# Patient Record
Sex: Female | Born: 1970
Health system: Southern US, Community
[De-identification: ages and names within clinical notes are randomized; demographics above are authoritative.]

## PROBLEM LIST (undated history)

## (undated) DIAGNOSIS — N926 Irregular menstruation, unspecified: Secondary | ICD-10-CM

## (undated) DIAGNOSIS — M545 Low back pain, unspecified: Secondary | ICD-10-CM

## (undated) DIAGNOSIS — D219 Benign neoplasm of connective and other soft tissue, unspecified: Secondary | ICD-10-CM

## (undated) DIAGNOSIS — E785 Hyperlipidemia, unspecified: Secondary | ICD-10-CM

## (undated) DIAGNOSIS — G43909 Migraine, unspecified, not intractable, without status migrainosus: Secondary | ICD-10-CM

## (undated) DIAGNOSIS — E559 Vitamin D deficiency, unspecified: Secondary | ICD-10-CM

## (undated) DIAGNOSIS — IMO0002 Reserved for concepts with insufficient information to code with codable children: Secondary | ICD-10-CM

## (undated) HISTORY — DX: Irregular menstruation, unspecified: N92.6

## (undated) HISTORY — DX: Hyperlipidemia, unspecified: E78.5

## (undated) HISTORY — DX: Reserved for concepts with insufficient information to code with codable children: IMO0002

## (undated) HISTORY — DX: Benign neoplasm of connective and other soft tissue, unspecified: D21.9

## (undated) HISTORY — DX: Vitamin D deficiency, unspecified: E55.9

## (undated) HISTORY — DX: Migraine, unspecified, not intractable, without status migrainosus: G43.909

## (undated) HISTORY — PX: WISDOM TOOTH EXTRACTION: SHX21

---

## 1997-11-01 ENCOUNTER — Other Ambulatory Visit: Admission: RE | Admit: 1997-11-01 | Discharge: 1997-11-01 | Payer: Self-pay | Admitting: Internal Medicine

## 1998-11-01 ENCOUNTER — Other Ambulatory Visit: Admission: RE | Admit: 1998-11-01 | Discharge: 1998-11-01 | Payer: Self-pay | Admitting: Internal Medicine

## 2001-09-26 ENCOUNTER — Other Ambulatory Visit: Admission: RE | Admit: 2001-09-26 | Discharge: 2001-09-26 | Payer: Self-pay | Admitting: Obstetrics and Gynecology

## 2002-11-03 ENCOUNTER — Other Ambulatory Visit: Admission: RE | Admit: 2002-11-03 | Discharge: 2002-11-03 | Payer: Self-pay | Admitting: Obstetrics and Gynecology

## 2004-01-24 ENCOUNTER — Other Ambulatory Visit: Admission: RE | Admit: 2004-01-24 | Discharge: 2004-01-24 | Payer: Self-pay | Admitting: Obstetrics and Gynecology

## 2005-01-30 ENCOUNTER — Other Ambulatory Visit: Admission: RE | Admit: 2005-01-30 | Discharge: 2005-01-30 | Payer: Self-pay | Admitting: Obstetrics and Gynecology

## 2006-03-21 ENCOUNTER — Other Ambulatory Visit: Admission: RE | Admit: 2006-03-21 | Discharge: 2006-03-21 | Payer: Self-pay | Admitting: Obstetrics & Gynecology

## 2007-01-30 ENCOUNTER — Other Ambulatory Visit: Admission: RE | Admit: 2007-01-30 | Discharge: 2007-01-30 | Payer: Self-pay | Admitting: Obstetrics & Gynecology

## 2008-03-02 ENCOUNTER — Other Ambulatory Visit: Admission: RE | Admit: 2008-03-02 | Discharge: 2008-03-02 | Payer: Self-pay | Admitting: Obstetrics and Gynecology

## 2012-05-26 ENCOUNTER — Encounter: Payer: Self-pay | Admitting: *Deleted

## 2012-05-27 ENCOUNTER — Encounter: Payer: Self-pay | Admitting: Nurse Practitioner

## 2012-05-27 ENCOUNTER — Ambulatory Visit (INDEPENDENT_AMBULATORY_CARE_PROVIDER_SITE_OTHER): Payer: 59 | Admitting: Nurse Practitioner

## 2012-05-27 VITALS — BP 112/60 | HR 66 | Resp 14 | Ht 67.0 in | Wt 148.2 lb

## 2012-05-27 DIAGNOSIS — IMO0001 Reserved for inherently not codable concepts without codable children: Secondary | ICD-10-CM

## 2012-05-27 DIAGNOSIS — Z01419 Encounter for gynecological examination (general) (routine) without abnormal findings: Secondary | ICD-10-CM

## 2012-05-27 DIAGNOSIS — Z309 Encounter for contraceptive management, unspecified: Secondary | ICD-10-CM

## 2012-05-27 DIAGNOSIS — Z0000 Encounter for general adult medical examination without abnormal findings: Secondary | ICD-10-CM

## 2012-05-27 LAB — CBC
Hemoglobin: 13.9 g/dL (ref 12.0–15.0)
MCH: 30.3 pg (ref 26.0–34.0)
MCHC: 33.7 g/dL (ref 30.0–36.0)
MCV: 90 fL (ref 78.0–100.0)
Platelets: 256 10*3/uL (ref 150–400)

## 2012-05-27 LAB — COMPREHENSIVE METABOLIC PANEL
AST: 14 U/L (ref 0–37)
Albumin: 4 g/dL (ref 3.5–5.2)
Alkaline Phosphatase: 36 U/L — ABNORMAL LOW (ref 39–117)
Potassium: 4.6 mEq/L (ref 3.5–5.3)
Sodium: 138 mEq/L (ref 135–145)
Total Protein: 7 g/dL (ref 6.0–8.3)

## 2012-05-27 LAB — POCT URINALYSIS DIPSTICK
Spec Grav, UA: 1.015
Urobilinogen, UA: NEGATIVE
pH, UA: 6.5

## 2012-05-27 LAB — TSH: TSH: 1.197 u[IU]/mL (ref 0.350–4.500)

## 2012-05-27 LAB — LIPID PANEL
Total CHOL/HDL Ratio: 3.1 Ratio
VLDL: 11 mg/dL (ref 0–40)

## 2012-05-27 MED ORDER — LEVONORGESTREL-ETHINYL ESTRAD 0.15-30 MG-MCG PO TABS: 1.0000 | ORAL_TABLET | Freq: Every day | ORAL | Status: DC

## 2012-05-27 NOTE — Patient Instructions (Signed)

## 2012-05-27 NOTE — Progress Notes (Signed)
42 y.o. Single African American Fe here for annual exam.  Menses is regular.  Some cramps but tolerable with OTC NSAID'S.Same partner almost 2 years.Not sexually active. 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  Patient's last menstrual period was 05/07/2012.          Sexually active: no  The current method of family planning is OCP (estrogen/progesterone).    Exercising: no  The patient does not participate in regular exercise at present. Smoker:  no  Health Maintenance: Pap:  05/18/2011 normal with neg HR HPV MMG:  2011 normal Colonoscopy:  never TDaP:  01/30/2007 Labs: Hgb- 13.7; PCP is requesting labs: CMP, LIPIDS, TSH, VIT D, CBC she is fasting today.   reports that she has never smoked. She has never used smokeless tobacco. She reports that she does not drink alcohol or use illicit drugs.  Past Medical History  Diagnosis Date  . Irregular menses   . Abnormal pap     ASCUS as a teenager     History reviewed. No pertinent past surgical history.  Current Outpatient Prescriptions  Medication Sig Dispense Refill  . fluticasone (FLONASE) 50 MCG/ACT nasal spray Place 2 sprays into the nose daily.      Marland Kitchen ibuprofen (ADVIL,MOTRIN) 100 MG chewable tablet Chew 100 mg by mouth every 8 (eight) hours as needed for fever.      Marland Kitchen levonorgestrel-ethinyl estradiol (NORDETTE) 0.15-30 MG-MCG tablet Take 1 tablet by mouth daily.      . montelukast (SINGULAIR) 10 MG tablet Take 10 mg by mouth as needed.      . Prenatal Vit-Fe Fumarate-FA (MULTIVITAMIN-PRENATAL) 27-0.8 MG TABS Take 1 tablet by mouth daily at 12 noon.      . vitamin B-12 (CYANOCOBALAMIN) 100 MCG tablet Take 50 mcg by mouth daily.      . calcium carbonate (OS-CAL - DOSED IN MG OF ELEMENTAL CALCIUM) 1250 MG tablet Take 1 tablet by mouth daily.      . Calcium-Vitamin D-Vitamin K (CALCIUM + D + K) 750-500-40 MG-UNT-MCG TABS Take by mouth daily.      . Ibuprofen & Caffeine-Vitamins 400 MG KIT Take by mouth  as needed.       No current facility-administered medications for this visit.    Family History  Problem Relation Age of Onset  . Cancer Mother     ROS:  Pertinent items are noted in HPI.  Otherwise, a comprehensive ROS was negative.  Exam:   BP 112/60  Pulse 66  Resp 14  Ht 5\' 7"  (1.702 m)  Wt 148 lb 3.2 oz (67.223 kg)  BMI 23.21 kg/m2  LMP 05/07/2012 Height: 5\' 7"  (170.2 cm)  Ht Readings from Last 3 Encounters:  05/27/12 5\' 7"  (1.702 m)    General appearance: alert, cooperative and appears stated age Head: Normocephalic, without obvious abnormality, atraumatic Neck: no adenopathy, supple, symmetrical, trachea midline and thyroid normal to inspection and palpation Lungs: clear to auscultation bilaterally Breasts: normal appearance, no masses or tenderness Heart: regular rate and rhythm Abdomen: soft, non-tender; no masses,  no organomegaly Extremities: extremities normal, atraumatic, no cyanosis or edema Skin: Skin color, texture, turgor normal. No rashes or lesions Lymph nodes: Cervical, supraclavicular, and axillary nodes normal. No abnormal inguinal nodes palpated Neurologic: Grossly normal   Pelvic: External genitalia:  no lesions              Urethra:  normal appearing urethra with no masses, tenderness or lesions  Bartholin's and Skene's: normal                 Vagina: normal appearing vagina with normal color and discharge, no lesions              Cervix: anteverted              Pap taken: no Bimanual Exam:  Uterus:uppper  normal size, contour, position, consistency, mobility, non-tender              Adnexa: no mass, fullness, tenderness               Rectovaginal: Confirms               Anus:  normal sphincter tone, no lesions  A:  Well Woman with normal exam  Not SA; OCP for cycle regulation  P:   Pap smear as per guidelines   Mammogram at next Birthday  counseled on adequate intake of calcium and vitamin D,   diet and exercise  return  annually or prn  An After Visit Summary was printed and given to the patient.

## 2012-05-29 ENCOUNTER — Telehealth: Payer: Self-pay | Admitting: *Deleted

## 2012-05-29 NOTE — Telephone Encounter (Signed)
Message copied by Osie Bond on Thu May 29, 2012  1:46 PM ------      Message from: Ria Comment R      Created: Wed May 28, 2012 12:30 PM       Send patient the results. All is good. ------

## 2012-05-29 NOTE — Telephone Encounter (Signed)
LVM for pt to return my in regards to lab results.

## 2012-05-29 NOTE — Telephone Encounter (Signed)
Message copied by Osie Bond on Thu May 29, 2012  2:50 PM ------      Message from: Ria Comment R      Created: Wed May 28, 2012 12:30 PM       Send patient the results. All is good. ------

## 2012-05-29 NOTE — Telephone Encounter (Signed)
Pt is aware of all lab results and is agreeable to 600-800 IU of vitamin D (OTC) qd.

## 2012-05-29 NOTE — Progress Notes (Signed)
Encounter reviewed by Dr. Daniesha Driver Silva.  

## 2012-06-03 ENCOUNTER — Other Ambulatory Visit: Payer: Self-pay | Admitting: Nurse Practitioner

## 2012-11-19 ENCOUNTER — Telehealth: Payer: Self-pay | Admitting: Nurse Practitioner

## 2012-11-19 NOTE — Telephone Encounter (Signed)
Patient has a preference in regards to nurse practitioner practice. Patient is also a Publishing rights manager.

## 2012-12-01 NOTE — Telephone Encounter (Signed)
I don't know of any  NP practices alone, there are numerous internal and family practices that have NP's

## 2013-01-28 ENCOUNTER — Ambulatory Visit: Payer: 59 | Admitting: Nurse Practitioner

## 2013-01-28 ENCOUNTER — Telehealth: Payer: Self-pay | Admitting: Nurse Practitioner

## 2013-01-28 NOTE — Telephone Encounter (Signed)
Pt rescheduled appt for today because she can't get off work. Rescheduled to 02/06/13.

## 2013-02-06 ENCOUNTER — Ambulatory Visit (INDEPENDENT_AMBULATORY_CARE_PROVIDER_SITE_OTHER): Payer: BC Managed Care – PPO | Admitting: Nurse Practitioner

## 2013-02-06 ENCOUNTER — Encounter: Payer: Self-pay | Admitting: Nurse Practitioner

## 2013-02-06 VITALS — BP 92/60 | HR 64 | Ht 67.0 in | Wt 150.0 lb

## 2013-02-06 DIAGNOSIS — N952 Postmenopausal atrophic vaginitis: Secondary | ICD-10-CM

## 2013-02-06 MED ORDER — ESTRADIOL 0.1 MG/GM VA CREA: TOPICAL_CREAM | VAGINAL | Status: DC

## 2013-02-06 NOTE — Progress Notes (Signed)
Subjective:     Patient ID: Mallory Jimenez, female   DOB: June 30, 1970, 43 y.o.   MRN: [id number]  HPI This 43 yo SAA Fe presents for a consult visit to discuss an increase in vulvar dryness with wiping or bathing for about 6 months.  Not SA very often but no symptoms with SA and does not need to use OTC lubrication.  Still on Levora  OCP.  Most of symptoms are externally.  No urinary symptoms.  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 She is still dating same partner and they have made no decisions on childbearing and getting married yet.   Review of Systems  Constitutional: Negative.   HENT: Negative.   Respiratory: Negative.   Cardiovascular: Negative.   Gastrointestinal: Negative.   Genitourinary: Negative.   Musculoskeletal: Negative.   Skin: Negative.   Neurological: Negative.   Psychiatric/Behavioral: Negative.        Objective:   Physical Exam  Constitutional: She is oriented to person, place, and time. She appears well-developed and well-nourished. No distress.  Exam is not indicated today,  Neurological: She is alert and oriented to person, place, and time.  Psychiatric: She has a normal mood and affect. Her behavior is normal. Judgment and thought content normal.       Assessment:     Vulvar dryness    Plan:     Will try estrace vaginal cream pea size amount to external vulvar and introitus as directed twice weekly. Will recheck at AEX. She prefers no oral treatment unless needed     consult time is 15 minutes.

## 2013-02-06 NOTE — Patient Instructions (Signed)
Atrophic Vaginitis Atrophic vaginitis is a problem of low levels of estrogen in women. This problem can happen at any age. It is most common in women who have gone through menopause ("the change").  HOW WILL I KNOW IF I HAVE THIS PROBLEM? You may have:  Trouble with peeing (urinating), such as:  Going to the bathroom often.  A hard time holding your pee until you reach a bathroom.  Leaking pee.  Having pain when you pee.  Itching or a burning feeling.  Vaginal bleeding and spotting.  Pain during sex.  Dryness of the vagina.  A yellow, bad-smelling fluid (discharge) coming from the vagina. HOW WILL MY DOCTOR CHECK FOR THIS PROBLEM?  During your exam, your doctor will likely find the problem.  If there is a vaginal fluid, it may be checked for infection. HOW WILL THIS PROBLEM BE TREATED? Keep the vulvar skin as clean as possible. Moisturizers and lubricants can help with some of the symptoms. Estrogen replacement can help. There are 2 ways to take estrogen:  Systemic estrogen gets estrogen to your whole body. It takes many weeks or months before the symptoms get better.  You take an estrogen pill.  You use a skin patch. This is a patch that you put on your skin.  If you still have your uterus, your doctor may ask you to take a hormone. Talk to your doctor about the right medicine for you.  Estrogen cream.  This puts estrogen only at the part of your body where you apply it. The cream is put into the vagina or put on the vulvar skin. For some women, estrogen cream works faster than pills or the patch. CAN ALL WOMEN WITH THIS PROBLEM USE ESTROGEN? No. Women with certain types of cancer, liver problems, or problems with blood clots should not take estrogen. Your doctor can help you decide the best treatment for your symptoms. Document Released: 06/27/2007 Document Revised: 04/02/2011 Document Reviewed: 06/27/2007 ExitCare Patient Information 2014 [employer].  

## 2013-02-09 NOTE — Progress Notes (Signed)
Encounter reviewed by Dr. Virgal Warmuth Silva.  

## 2013-03-30 ENCOUNTER — Encounter: Payer: Self-pay | Admitting: Nurse Practitioner

## 2013-04-02 ENCOUNTER — Telehealth: Payer: Self-pay | Admitting: Nurse Practitioner

## 2013-04-02 NOTE — Telephone Encounter (Signed)
Per patient request in MyChart last labs were routed via EPIC and last pap was faxed manually from patient's paper chart to PCP. MyChart message sent to patient to notify her of this with note to let us know if PCP does not receive records.

## 2013-05-03 ENCOUNTER — Other Ambulatory Visit: Payer: Self-pay | Admitting: Nurse Practitioner

## 2013-05-08 ENCOUNTER — Other Ambulatory Visit: Payer: Self-pay | Admitting: *Deleted

## 2013-05-08 DIAGNOSIS — IMO0001 Reserved for inherently not codable concepts without codable children: Secondary | ICD-10-CM

## 2013-05-08 MED ORDER — LEVONORGESTREL-ETHINYL ESTRAD 0.15-30 MG-MCG PO TABS: 1.0000 | ORAL_TABLET | Freq: Every day | ORAL | Status: DC

## 2013-05-08 NOTE — Telephone Encounter (Signed)
[facility] pharmacist Jeani Hawking called requesting refill for pt- Levora.  Last AEX and refill 5/614 x 1 year Next appt 05/28/13  Approved 28 days of Rx. Patient will need AEX for further refills. - Pharmacist notified.

## 2013-05-28 ENCOUNTER — Encounter: Payer: Self-pay | Admitting: Nurse Practitioner

## 2013-05-28 ENCOUNTER — Ambulatory Visit (INDEPENDENT_AMBULATORY_CARE_PROVIDER_SITE_OTHER): Payer: BC Managed Care – PPO | Admitting: Nurse Practitioner

## 2013-05-28 VITALS — BP 100/64 | HR 92 | Ht 67.0 in | Wt 148.0 lb

## 2013-05-28 DIAGNOSIS — Z0000 Encounter for general adult medical examination without abnormal findings: Secondary | ICD-10-CM

## 2013-05-28 DIAGNOSIS — R8290 Unspecified abnormal findings in urine: Secondary | ICD-10-CM

## 2013-05-28 DIAGNOSIS — R82998 Other abnormal findings in urine: Secondary | ICD-10-CM

## 2013-05-28 DIAGNOSIS — Z01419 Encounter for gynecological examination (general) (routine) without abnormal findings: Secondary | ICD-10-CM

## 2013-05-28 LAB — POCT URINALYSIS DIPSTICK
Bilirubin, UA: NEGATIVE
Glucose, UA: NEGATIVE
Ketones, UA: NEGATIVE
NITRITE UA: NEGATIVE
Protein, UA: NEGATIVE
RBC UA: NEGATIVE
UROBILINOGEN UA: NEGATIVE
pH, UA: 8

## 2013-05-28 MED ORDER — LEVONORGESTREL-ETHINYL ESTRAD 0.15-30 MG-MCG PO TABS: 1.0000 | ORAL_TABLET | Freq: Every day | ORAL | Status: DC

## 2013-05-28 NOTE — Patient Instructions (Signed)

## 2013-05-28 NOTE — Progress Notes (Signed)
Patient ID: Mallory Jimenez, female   DOB: 1970-10-03, 43 y.o.   MRN: [id number] 42 y.o. G0P0 Single African American Fe here for annual exam.  Menses are regular, at 5 days.moderate to light. On OCP for cycle regulation.  Ended almost 3 year relationship.  "He has to find himself".  They were never SA. She is disappointed in her long range plans of children with him.  She is almost completed school for PHD in [employer].  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  Patient's last menstrual period was 05/04/2013.          Sexually active: no  The current method of family planning is OCP (estrogen/progesterone) and abstinence.    Exercising: no  The patient does not participate in regular exercise at present. Smoker:  no  Health Maintenance: Pap:  05/18/2011 normal with neg HR HPV (remote history of ASCUS as teen) MMG:  05/26/13, [facility],  normal TDaP:  01/30/2007 Labs: PCP  Urine:  Trace Leuk's   reports that she has never smoked. She has never used smokeless tobacco. She reports that she does not drink alcohol or use illicit drugs.  Past Medical History  Diagnosis Date  . Irregular menses   . Abnormal pap     ASCUS as a teenager     Past Surgical History  Procedure Laterality Date  . Wisdom tooth extraction Bilateral age 39    Current Outpatient Prescriptions  Medication Sig Dispense Refill  . Ascorbic Acid (VITAMIN C) 100 MG tablet Take 100 mg by mouth daily.      . Cholecalciferol (VITAMIN D) 1000 UNITS capsule Take 1,000 Units by mouth daily.      Marland Kitchen estradiol (ESTRACE) 0.1 MG/GM vaginal cream Use 1 gm twice weekly  42.5 g  3  . fluticasone (FLONASE) 50 MCG/ACT nasal spray Place 2 sprays into the nose daily.      Marland Kitchen ibuprofen (ADVIL,MOTRIN) 100 MG chewable tablet Chew 100 mg by mouth every 8 (eight) hours as needed for fever.      Marland Kitchen levonorgestrel-ethinyl estradiol (LEVORA 0.15/30, 28,) 0.15-30 MG-MCG tablet Take 1  tablet by mouth daily.  3 Package  3  . montelukast (SINGULAIR) 10 MG tablet Take 10 mg by mouth as needed.      . Prenatal Vit-Fe Fumarate-FA (MULTIVITAMIN-PRENATAL) 27-0.8 MG TABS Take 1 tablet by mouth daily at 12 noon.      . vitamin B-12 (CYANOCOBALAMIN) 100 MCG tablet Take 50 mcg by mouth daily.      . Vitamin D, Ergocalciferol, (DRISDOL) 50000 UNITS CAPS capsule Take 1 capsule by mouth once a week.       No current facility-administered medications for this visit.    Family History  Problem Relation Age of Onset  . Hypertension Mother     had 2 tumorss abdominal that were benign, knee replacements  . Hypertension Father   . Hypertension Maternal Grandmother   . Hypertension Paternal Grandmother     back surgery    ROS:  Pertinent items are noted in HPI.  Otherwise, a comprehensive ROS was negative.  Exam:   BP 100/64  Pulse 92  Ht 5\' 7"  (1.702 m)  Wt 148 lb (67.132 kg)  BMI 23.17 kg/m2  LMP 05/04/2013 Height: 5\' 7"  (170.2 cm)  Ht Readings from Last 3 Encounters:  05/28/13 5\' 7"  (1.702 m)  02/06/13 5\' 7"  (1.702 m)  05/27/12 5\' 7"  (1.702 m)  General appearance: alert, cooperative and appears stated age Head: Normocephalic, without obvious abnormality, atraumatic Neck: no adenopathy, supple, symmetrical, trachea midline and thyroid normal to inspection and palpation Lungs: clear to auscultation bilaterally Breasts: normal appearance, no masses or tenderness Heart: regular rate and rhythm Abdomen: soft, non-tender; no masses,  no organomegaly Extremities: extremities normal, atraumatic, no cyanosis or edema Skin: Skin color, texture, turgor normal. No rashes or lesions Lymph nodes: Cervical, supraclavicular, and axillary nodes normal. No abnormal inguinal nodes palpated Neurologic: Grossly normal   Pelvic: External genitalia:  no lesions              Urethra:  normal appearing urethra with no masses, tenderness or lesions              Bartholin's and Skene's:  normal                 Vagina: normal appearing vagina with normal color and discharge, no lesions              Cervix: anteverted              Pap taken: yes Bimanual Exam:  Uterus:  normal size, contour, position, consistency, mobility, non-tender              Adnexa: no mass, fullness, tenderness               Rectovaginal: Confirms               Anus:  normal sphincter tone, no lesions  A:  Well Woman with normal exam  OCP for cycle regulation  Not SA since about 2009  History of Vit D deficiency  P:   Reviewed health and wellness pertinent to exam  Pap smear taken today  Mammogram due 05/2014  Refill Levora for a year, does not need refill of Estrace at this time.  Counseled on breast self exam, mammography screening, use and side effects of OCP's, adequate intake of calcium and vitamin D, diet and exercise return annually or prn  An After Visit Summary was printed and given to the patient.  I am very proud of her for accomplishment of her goals both educationally and personal relationship with God.

## 2013-05-29 LAB — URINE CULTURE
Colony Count: NO GROWTH
Organism ID, Bacteria: NO GROWTH

## 2013-06-01 LAB — IPS PAP TEST WITH HPV

## 2013-06-02 ENCOUNTER — Other Ambulatory Visit: Payer: Self-pay | Admitting: Nurse Practitioner

## 2013-06-02 NOTE — Telephone Encounter (Signed)
Last refill 05/28/13 #3/ 3 refill as phone in. Rx resend as normal as prescribed.

## 2013-06-03 NOTE — Progress Notes (Signed)
Encounter reviewed by Dr. Tove Wideman Silva.  

## 2013-11-06 ENCOUNTER — Other Ambulatory Visit: Payer: Self-pay

## 2014-05-05 ENCOUNTER — Other Ambulatory Visit: Payer: Self-pay | Admitting: Nurse Practitioner

## 2014-05-05 NOTE — Telephone Encounter (Signed)
Medication refill request: Levora  Last AEX:  05/28/13 PG Next AEX: 06/03/14 PG Last MMG (if hormonal medication request): none Refill authorized: 06/02/13 #3packs/3R. Today #1pack/0R?  Routed to DL

## 2014-06-03 ENCOUNTER — Encounter: Payer: Self-pay | Admitting: Nurse Practitioner

## 2014-06-03 ENCOUNTER — Ambulatory Visit (INDEPENDENT_AMBULATORY_CARE_PROVIDER_SITE_OTHER): Payer: BC Managed Care – PPO | Admitting: Nurse Practitioner

## 2014-06-03 VITALS — BP 96/62 | HR 64 | Ht 67.0 in | Wt 156.0 lb

## 2014-06-03 DIAGNOSIS — Z01419 Encounter for gynecological examination (general) (routine) without abnormal findings: Secondary | ICD-10-CM | POA: Diagnosis not present

## 2014-06-03 DIAGNOSIS — Z0000 Encounter for general adult medical examination without abnormal findings: Secondary | ICD-10-CM

## 2014-06-03 LAB — POCT URINALYSIS DIPSTICK
Bilirubin, UA: NEGATIVE
Blood, UA: NEGATIVE
GLUCOSE UA: NEGATIVE
KETONES UA: NEGATIVE
Leukocytes, UA: NEGATIVE
NITRITE UA: NEGATIVE
PH UA: 6
PROTEIN UA: NEGATIVE
UROBILINOGEN UA: NEGATIVE

## 2014-06-03 MED ORDER — LEVONORGESTREL-ETHINYL ESTRAD 0.15-30 MG-MCG PO TABS: 1.0000 | ORAL_TABLET | Freq: Every day | ORAL | Status: DC

## 2014-06-03 NOTE — Progress Notes (Signed)
Patient ID: Mallory Jimenez, female   DOB: 06-Aug-1970, 44 y.o.   MRN: [id number] 44 y.o. G0P0 Single  African American Fe here for annual exam. Menses now at 5 days.  Not dating.  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   Patient's last menstrual period was 05/03/2014 (approximate).          Sexually active: No.  The current method of family planning is OCP (estrogen/progesterone) and abstinence.    Exercising: Yes.    Gym/ health club routine includes aerobics. Smoker:  no  Health Maintenance: Pap:  05/28/13, negative with neg HR HPV MMG:  06/01/14, no results at time of visit, she will fax copy of report TDaP:  01/30/07  Labs:  HB:  PCP  Urine: negative   reports that she has never smoked. She has never used smokeless tobacco. She reports that she does not drink alcohol or use illicit drugs.  Past Medical History  Diagnosis Date  . Irregular menses   . Abnormal pap     ASCUS as a teenager     Past Surgical History  Procedure Laterality Date  . Wisdom tooth extraction Bilateral age 43    Current Outpatient Prescriptions  Medication Sig Dispense Refill  . Ascorbic Acid (VITAMIN C) 100 MG tablet Take 100 mg by mouth daily.    . Cholecalciferol (VITAMIN D) 1000 UNITS capsule Take 1,000 Units by mouth daily.    Marland Kitchen estradiol (ESTRACE) 0.1 MG/GM vaginal cream Use 1 gm twice weekly 42.5 g 3  . fluticasone (FLONASE) 50 MCG/ACT nasal spray Place 2 sprays into the nose daily.    Marland Kitchen ibuprofen (ADVIL,MOTRIN) 100 MG chewable tablet Chew 100 mg by mouth every 8 (eight) hours as needed for fever.    Marland Kitchen levonorgestrel-ethinyl estradiol (LEVORA 0.15/30, 28,) 0.15-30 MG-MCG tablet Take 1 tablet by mouth daily. 3 Package 3  . montelukast (SINGULAIR) 10 MG tablet Take 10 mg by mouth at bedtime.     . Prenatal Vit-Fe Fumarate-FA (MULTIVITAMIN-PRENATAL) 27-0.8 MG TABS Take 1 tablet by mouth daily at 12 noon.    . vitamin  B-12 (CYANOCOBALAMIN) 100 MCG tablet Take 50 mcg by mouth daily.    . Vitamin D, Ergocalciferol, (DRISDOL) 50000 UNITS CAPS capsule Take 1 capsule by mouth once a week.     No current facility-administered medications for this visit.    Family History  Problem Relation Age of Onset  . Hypertension Mother     had 2 tumorss abdominal that were benign, knee replacements  . Hypertension Father   . Hypertension Maternal Grandmother   . Hypertension Paternal Grandmother     back surgery    ROS:  Pertinent items are noted in HPI.  Otherwise, a comprehensive ROS was negative.  Exam:   BP 96/62 mmHg  Pulse 64  Ht 5\' 7"  (1.702 m)  Wt 156 lb (70.761 kg)  BMI 24.43 kg/m2  LMP 05/03/2014 (Approximate) Height: 5\' 7"  (170.2 cm) Ht Readings from Last 3 Encounters:  06/03/14 5\' 7"  (1.702 m)  05/28/13 5\' 7"  (1.702 m)  02/06/13 5\' 7"  (1.702 m)    General appearance: alert, cooperative and appears stated age Head: Normocephalic, without obvious abnormality, atraumatic Neck: no adenopathy, supple, symmetrical, trachea midline and thyroid normal to inspection and palpation Lungs: clear to auscultation bilaterally Breasts: normal appearance, no masses or tenderness Heart: regular rate and rhythm Abdomen: soft, non-tender;  no masses,  no organomegaly Extremities: extremities normal, atraumatic, no cyanosis or edema Skin: Skin color, texture, turgor normal. No rashes or lesions Lymph nodes: Cervical, supraclavicular, and axillary nodes normal. No abnormal inguinal nodes palpated Neurologic: Grossly normal   Pelvic: External genitalia:  no lesions              Urethra:  normal appearing urethra with no masses, tenderness or lesions              Bartholin's and Skene's: normal                 Vagina: normal appearing vagina with normal color and discharge, no lesions              Cervix: anteverted              Pap taken: No. Bimanual Exam:  Uterus:  normal size, contour, position,  consistency, mobility, non-tender              Adnexa: no mass, fullness, tenderness               Rectovaginal: Confirms               Anus:  normal sphincter tone, no lesions  Chaperone present: no  A:  Well Woman with normal exam  OCP for cycle regulation Not SA since about 2009 History of Vit D deficiency   P:   Reviewed health and wellness pertinent to exam  Pap smear not taken today  Mammogram is due 05/2015  Refill on OCP for a year - Levora  Counseled on breast self exam, mammography screening, use and side effects of OCP's, adequate intake of calcium and vitamin D, diet and exercise return annually or prn  An After Visit Summary was printed and given to the patient.

## 2014-06-03 NOTE — Patient Instructions (Signed)

## 2014-06-04 NOTE — Progress Notes (Signed)
Encounter reviewed by Dr. Teyonna Plaisted Silva.  

## 2014-06-07 ENCOUNTER — Telehealth: Payer: Self-pay | Admitting: Nurse Practitioner

## 2014-06-07 NOTE — Telephone Encounter (Signed)
Left message on voicemail to call. Records are ready to be picked up in drawer.

## 2014-06-17 NOTE — Telephone Encounter (Signed)
Per phone call to patient, she will come by and pick records up this afternoon.

## 2015-05-06 ENCOUNTER — Other Ambulatory Visit: Payer: Self-pay | Admitting: Nurse Practitioner

## 2015-05-09 NOTE — Telephone Encounter (Signed)
Medication refill request: OCP Last AEX:  06-03-14 Next AEX: 06-03-15 Last MMG (if hormonal medication request): Never Refill authorized: please advise

## 2015-06-06 ENCOUNTER — Encounter: Payer: Self-pay | Admitting: Nurse Practitioner

## 2015-06-06 ENCOUNTER — Ambulatory Visit (INDEPENDENT_AMBULATORY_CARE_PROVIDER_SITE_OTHER): Payer: BC Managed Care – PPO | Admitting: Nurse Practitioner

## 2015-06-06 ENCOUNTER — Telehealth: Payer: Self-pay | Admitting: Nurse Practitioner

## 2015-06-06 VITALS — BP 100/66 | HR 72 | Ht 67.0 in | Wt 155.0 lb

## 2015-06-06 DIAGNOSIS — Z0000 Encounter for general adult medical examination without abnormal findings: Secondary | ICD-10-CM | POA: Diagnosis not present

## 2015-06-06 DIAGNOSIS — N852 Hypertrophy of uterus: Secondary | ICD-10-CM

## 2015-06-06 DIAGNOSIS — Z01419 Encounter for gynecological examination (general) (routine) without abnormal findings: Secondary | ICD-10-CM | POA: Diagnosis not present

## 2015-06-06 LAB — POCT URINALYSIS DIPSTICK
BILIRUBIN UA: NEGATIVE
Glucose, UA: NEGATIVE
KETONES UA: NEGATIVE
LEUKOCYTES UA: NEGATIVE
NITRITE UA: NEGATIVE
PH UA: 6
PROTEIN UA: NEGATIVE
RBC UA: NEGATIVE
Urobilinogen, UA: NEGATIVE

## 2015-06-06 LAB — HIV ANTIBODY (ROUTINE TESTING W REFLEX): HIV 1&2 Ab, 4th Generation: NONREACTIVE

## 2015-06-06 MED ORDER — LEVONORGESTREL-ETHINYL ESTRAD 0.15-30 MG-MCG PO TABS: 1.0000 | ORAL_TABLET | Freq: Every day | ORAL | Status: DC

## 2015-06-06 NOTE — Telephone Encounter (Signed)
Patient is getting her Vitamin D from her PCP. Patient does not need this added to her labs today.

## 2015-06-06 NOTE — Progress Notes (Signed)
Patient ID: Mallory Jimenez, female   DOB: 08-29-1970, 45 y.o.   MRN: [id number]  45 y.o. G0P0000 Single  African American Fe here for annual exam.  Menses now at 4-5 days.  Moderate to light. Some cramps X 1 day.   She remains on OCP for cycle regulation.  She is still on RX Vit D and thinks PCP is giving her RX.  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  She is very excited and pleased with her job.  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  She is not dating or SA.  Patient's last menstrual period was 06/01/2015 (exact date).          Sexually active: No.  The current method of family planning is OCP (estrogen/progesterone).    Exercising: No. No regular exercise. Smoker:  no  Health Maintenance: Pap: 05/28/13, Negative with neg HR HPV MMG:  2011 normal TDaP:  01/30/2007 HIV: done today Labs: HB: 13.0  Urine: negative   reports that she has never smoked. She has never used smokeless tobacco. She reports that she does not drink alcohol or use illicit drugs.  Past Medical History  Diagnosis Date  . Irregular menses   . Abnormal pap     ASCUS as a teenager     Past Surgical History  Procedure Laterality Date  . Wisdom tooth extraction Bilateral age 75    Current Outpatient Prescriptions  Medication Sig Dispense Refill  . aspirin EC 81 MG tablet Take 81 mg by mouth 2 (two) times a week.    Marland Kitchen b complex vitamins tablet Take 1 tablet by mouth daily.    . Biotin 1000 MCG tablet Take 1,000 mcg by mouth daily.    . Calcium Carb-Cholecalciferol (CALCIUM 600/VITAMIN D3) 600-800 MG-UNIT TABS Take 1 tablet by mouth daily.    Marland Kitchen estradiol (ESTRACE) 0.1 MG/GM vaginal cream Use 1 gm twice weekly 42.5 g 3  . fluticasone (FLONASE) 50 MCG/ACT nasal spray Place 2 sprays into the nose daily.    Marland Kitchen ibuprofen (ADVIL,MOTRIN) 100 MG chewable tablet Chew 100 mg by mouth every 8 (eight) hours as needed for fever.    Marland Kitchen levonorgestrel-ethinyl estradiol (LEVORA  0.15/30, 28,) 0.15-30 MG-MCG tablet Take 1 tablet by mouth daily. 84 tablet 4  . montelukast (SINGULAIR) 10 MG tablet Take 10 mg by mouth at bedtime.     . Prenatal Vit-Fe Fumarate-FA (MULTIVITAMIN-PRENATAL) 27-0.8 MG TABS Take 1 tablet by mouth daily at 12 noon.    . vitamin C (ASCORBIC ACID) 500 MG tablet Take 500 mg by mouth daily.    . Vitamin D, Ergocalciferol, (DRISDOL) 50000 UNITS CAPS capsule Take 1 capsule by mouth once a week.     No current facility-administered medications for this visit.    Family History  Problem Relation Age of Onset  . Hypertension Mother   . Other Mother     benign abdominal tumor x 2  . Hypertension Father   . Hypertension Maternal Grandmother   . Hypertension Paternal Grandmother   . Lymphoma Cousin 30    Non hogkins lymphoma    ROS:  Pertinent items are noted in HPI.  Otherwise, a comprehensive ROS was negative.  Exam:   BP 100/66 mmHg  Pulse 72  Ht 5\' 7"  (1.702 m)  Wt 155 lb (70.308 kg)  BMI 24.27 kg/m2  LMP 06/01/2015 (Exact Date) Height: 5\' 7"  (170.2 cm) Ht Readings from Last 3 Encounters:  06/06/15 5'  7" (1.702 m)  06/03/14 5\' 7"  (1.702 m)  05/28/13 5\' 7"  (1.702 m)    General appearance: alert, cooperative and appears stated age Head: Normocephalic, without obvious abnormality, atraumatic Neck: no adenopathy, supple, symmetrical, trachea midline and thyroid normal to inspection and palpation Lungs: clear to auscultation bilaterally Breasts: normal appearance, no masses or tenderness Heart: regular rate and rhythm Abdomen: soft, non-tender; no masses,  no organomegaly Extremities: extremities normal, atraumatic, no cyanosis or edema Skin: Skin color, texture, turgor normal. No rashes or lesions Lymph nodes: Cervical, supraclavicular, and axillary nodes normal. No abnormal inguinal nodes palpated Neurologic: Grossly normal   Pelvic: External genitalia:  no lesions              Urethra:  normal appearing urethra with no masses,  tenderness or lesions              Bartholin's and Skene's: normal                 Vagina: normal appearing vagina with normal color and discharge, no lesions              Cervix: anteverted              Pap taken: No. Bimanual Exam:  Uterus:  enlarged, 6 weeks size              Adnexa: no mass, fullness, tenderness               Rectovaginal: Confirms               Anus:  normal sphincter tone, no lesions  Chaperone present: no  A:  Well Woman with normal exam  OCP for cycle regulation Not SA since about 2009 History of Vit D deficiency  Enlarged uterus ? Fibroid - no prior notation of this    P:   Reviewed health and wellness pertinent to exam  Pap smear as above  Mammogram is due now and will get next week.  She will also sign ROI for her last Mammo a year ago.  Will get PUS and evaluate possible fibroid uterus vs position.  Will follow labs  Counseled on breast self exam, mammography screening, use and side effects of OCP's, adequate intake of calcium and vitamin D, diet and exercise return annually or prn  An After Visit Summary was printed and given to the patient.

## 2015-06-06 NOTE — Patient Instructions (Signed)

## 2015-06-07 ENCOUNTER — Telehealth: Payer: Self-pay | Admitting: Nurse Practitioner

## 2015-06-07 LAB — HEMOGLOBIN, FINGERSTICK: HEMOGLOBIN, FINGERSTICK: 13 g/dL (ref 12.0–16.0)

## 2015-06-07 NOTE — Telephone Encounter (Signed)
OK thanks for information.

## 2015-06-07 NOTE — Telephone Encounter (Signed)
Called patient to review benefits for a recommended procedure. Left Voicemail requesting a call back. °

## 2015-06-08 NOTE — Telephone Encounter (Signed)
OK for this date and time as this is non emergent.  Ok to close encounter.

## 2015-06-08 NOTE — Telephone Encounter (Signed)
Spoke with patient regarding benefits for recommended ultrasound. Patient has scheduling conflicts with Tuesdays and Thursdays, due to her work schedule. The first available appointment that will work with her schedule is 06/30/15.  Appointment has been scheduled with Dr Quincy Simmonds on 06/30/15. Routing to [employer] for final approval

## 2015-06-12 NOTE — Progress Notes (Signed)
Encounter reviewed by Dr. Brook Amundson C. Silva.  

## 2015-06-28 ENCOUNTER — Telehealth: Payer: Self-pay | Admitting: Obstetrics and Gynecology

## 2015-06-28 NOTE — Telephone Encounter (Signed)
Left message requesting patient to return call.  We will need to discuss making changes to her ultrasound appointment on 06/30/15

## 2015-06-28 NOTE — Telephone Encounter (Signed)
Ok to close encounter per provider

## 2015-06-30 ENCOUNTER — Encounter: Payer: Self-pay | Admitting: Obstetrics and Gynecology

## 2015-06-30 ENCOUNTER — Ambulatory Visit (INDEPENDENT_AMBULATORY_CARE_PROVIDER_SITE_OTHER): Payer: BC Managed Care – PPO | Admitting: Obstetrics and Gynecology

## 2015-06-30 ENCOUNTER — Ambulatory Visit (INDEPENDENT_AMBULATORY_CARE_PROVIDER_SITE_OTHER): Payer: BC Managed Care – PPO

## 2015-06-30 VITALS — BP 100/64 | HR 76 | Ht 67.0 in | Wt 158.0 lb

## 2015-06-30 DIAGNOSIS — N852 Hypertrophy of uterus: Secondary | ICD-10-CM

## 2015-06-30 DIAGNOSIS — D259 Leiomyoma of uterus, unspecified: Secondary | ICD-10-CM | POA: Diagnosis not present

## 2015-06-30 NOTE — Progress Notes (Signed)
GYNECOLOGY  VISIT   HPI: 45 y.o.   Single  African American  female   [place] with Patient's last menstrual period was 06/01/2015 (approximate).   here for   Enlarged uterus on routine physical exam.  Uterus 6 week size.  On combined oral contraceptives for cycle regulation.  Menses are not as heavy.  Some low back pain intermittently.  Cramping with cycles which is not significant.   Considering childbearing.  No prior pregnancy. Looking for a good partner.  Patient is a Designer, jewellery and teaches.   GYNECOLOGIC HISTORY: Patient's last menstrual period was 06/01/2015 (approximate). Contraception:  OCPs.        OB History    Gravida Para Term Preterm AB TAB SAB Ectopic Multiple Living   0 0 0 0 0 0 0 0 0 0          There are no active problems to display for this patient.   Past Medical History  Diagnosis Date  . Irregular menses   . Abnormal pap     ASCUS as a teenager     Past Surgical History  Procedure Laterality Date  . Wisdom tooth extraction Bilateral age 45    Current Outpatient Prescriptions  Medication Sig Dispense Refill  . aspirin EC 81 MG tablet Take 81 mg by mouth 2 (two) times a week.    Marland Kitchen b complex vitamins tablet Take 1 tablet by mouth daily.    . Biotin 1000 MCG tablet Take 1,000 mcg by mouth daily.    . Calcium Carb-Cholecalciferol (CALCIUM 600/VITAMIN D3) 600-800 MG-UNIT TABS Take 1 tablet by mouth daily.    Marland Kitchen estradiol (ESTRACE) 0.1 MG/GM vaginal cream Use 1 gm twice weekly (Patient taking differently: as needed. Use 1 gm twice weekly) 42.5 g 3  . fluticasone (FLONASE) 50 MCG/ACT nasal spray Place 2 sprays into the nose as needed.     Marland Kitchen ibuprofen (ADVIL,MOTRIN) 100 MG chewable tablet Chew 100 mg by mouth every 8 (eight) hours as needed for fever.    Marland Kitchen levonorgestrel-ethinyl estradiol (LEVORA 0.15/30, 28,) 0.15-30 MG-MCG tablet Take 1 tablet by mouth daily. 84 tablet 4  . montelukast (SINGULAIR) 10 MG tablet Take 10 mg by mouth at bedtime.      . Prenatal Vit-Fe Fumarate-FA (MULTIVITAMIN-PRENATAL) 27-0.8 MG TABS Take 1 tablet by mouth daily at 12 noon.    . vitamin C (ASCORBIC ACID) 500 MG tablet Take 500 mg by mouth daily.    . Vitamin D, Ergocalciferol, (DRISDOL) 50000 UNITS CAPS capsule Take 1 capsule by mouth once a week.     No current facility-administered medications for this visit.     ALLERGIES: Review of patient's allergies indicates no known allergies.  Family History  Problem Relation Age of Onset  . Hypertension Mother   . Other Mother     benign abdominal tumor x 2  . Hypertension Father   . Hypertension Maternal Grandmother   . Hypertension Paternal Grandmother   . Lymphoma Cousin 30    Non hogkins lymphoma    Social History   Social History  . Marital Status: Single    Spouse Name: N/A  . Number of Children: 0  . Years of Education: N/A   Occupational History  .     Social History Main Topics  . Smoking status: Never Smoker   . Smokeless tobacco: Never Used  . Alcohol Use: No  . Drug Use: No  . Sexual Activity: No   Other Topics Concern  . Not  on file   Social History Narrative   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    ROS:  Pertinent items are noted in HPI.  PHYSICAL EXAMINATION:    BP 100/64 mmHg  Pulse 76  Ht 5\' 7"  (1.702 m)  Wt 158 lb (71.668 kg)  BMI 24.74 kg/m2  LMP 06/01/2015 (Approximate)    General appearance: alert, cooperative and appears stated age   Abdomen: soft, non-tender, mass on left lower abdomen extending two cm above the pubic symphysis.  Ultrasound images and report reviewed with patient.  Uterus with 3 fibroids:  8 mm, 14 mm, and 12 x 7 x 8 cm pedunculated right fibroid. EMS 2.24 mm.  Normal ovaries. Mild - mod free fluid in pelvis.  ASSESSMENT  Uterine fibroids. Large pedunculated fibroid. Desire for potential future pregnancy.   PLAN  Discussion of uterine fibroids.  Discussion of fertility.   I encouraged patient to meet with a fertility specialist to discuss her options for care including donor sperm or donor egg and sperm if needed.  She is also considering adoption.  I have provided written information from [facility] and ACOG regarding fibroids.  We has access to Up to Date and will also use this data bank for her own personal research.  We did discuss myomectomy and hysterectomy - abdominal and laparoscopic with use of a morcellation bag.  She will return in 6 months for a repeat ultrasound.  Return sooner for heavy bleeding, increased pain or pressure, or enlarging mass in abdomen.    An After Visit Summary was printed and given to the patient.  _25_____ minutes face to face time of which over 50% was spent in counseling.

## 2015-06-30 NOTE — Patient Instructions (Signed)
Uterine Fibroids Uterine fibroids are tissue masses (tumors) that can develop in the womb (uterus). They are also called leiomyomas. This type of tumor is not cancerous (benign) and does not spread to other parts of the body outside of the pelvic area, which is between the hip bones. Occasionally, fibroids may develop in the fallopian tubes, in the cervix, or on the support structures (ligaments) that surround the uterus. You can have one or many fibroids. Fibroids can vary in size, weight, and where they grow in the uterus. Some can become quite large. Most fibroids do not require medical treatment. CAUSES A fibroid can develop when a single uterine cell keeps growing (replicating). Most cells in the human body have a control mechanism that keeps them from replicating without control. SIGNS AND SYMPTOMS Symptoms may include:   Heavy bleeding during your period.  Bleeding or spotting between periods.  Pelvic pain and pressure.  Bladder problems, such as needing to urinate more often (urinary frequency) or urgently.  Inability to reproduce offspring (infertility).  Miscarriages. DIAGNOSIS Uterine fibroids are diagnosed through a physical exam. Your health care provider may feel the lumpy tumors during a pelvic exam. Ultrasonography and an MRI may be done to determine the size, location, and number of fibroids. TREATMENT Treatment may include:  Watchful waiting. This involves getting the fibroid checked by your health care provider to see if it grows or shrinks. Follow your health care provider's recommendations for how often to have this checked.  Hormone medicines. These can be taken by mouth or given through an intrauterine device (IUD).  Surgery.  Removing the fibroids (myomectomy) or the uterus (hysterectomy).  Removing blood supply to the fibroids (uterine artery embolization). If fibroids interfere with your fertility and you want to become pregnant, your health care provider  may recommend having the fibroids removed.  HOME CARE INSTRUCTIONS  Keep all follow-up visits as directed by your health care provider. This is important.  Take medicines only as directed by your health care provider.  If you were prescribed a hormone treatment, take the hormone medicines exactly as directed.  Do not take aspirin, because it can cause bleeding.  Ask your health care provider about taking iron pills and increasing the amount of dark green, leafy vegetables in your diet. These actions can help to boost your blood iron levels, which may be affected by heavy menstrual bleeding.  Pay close attention to your period and tell your health care provider about any changes, such as:  Increased blood flow that requires you to use more pads or tampons than usual per month.  A change in the number of days that your period lasts per month.  A change in symptoms that are associated with your period, such as abdominal cramping or back pain. SEEK MEDICAL CARE IF:  You have pelvic pain, back pain, or abdominal cramps that cannot be controlled with medicines.  You have an increase in bleeding between and during periods.  You soak tampons or pads in a half hour or less.  You feel lightheaded, extra tired, or weak. SEEK IMMEDIATE MEDICAL CARE IF:  You faint.  You have a sudden increase in pelvic pain.   This information is not intended to replace advice given to you by your health care provider. Make sure you discuss any questions you have with your health care provider.   Document Released: 01/06/2000 Document Revised: 01/29/2014 Document Reviewed: 07/07/2013 Elsevier Interactive Patient Education 2016 Elsevier Inc.  

## 2015-08-17 ENCOUNTER — Encounter: Payer: Self-pay | Admitting: Nurse Practitioner

## 2015-08-18 ENCOUNTER — Telehealth: Payer: Self-pay

## 2015-08-18 NOTE — Telephone Encounter (Signed)
See phone note. Appointment scheduled for 08-24-15 with Dr Quincy Simmonds. Ultrasound scheduled for 01-05-16.  Routing to provider for final review. Patient agreeable to disposition. Will close encounter.

## 2015-08-18 NOTE — Telephone Encounter (Signed)
Mallory Jimenez and Dr Quincy Simmonds reviewed call. Dr Quincy Simmonds offers office visit to reassess fibroids and discuss potential options. Call to patient and advised of recommendations. Appointment scheduled for 08-24-15 with Dr Quincy Simmonds. Patient is expecting menses sometime in the next week. Will call to reschedule if menses begins heavily before appointment.  Routing to provider for final review. Patient agreeable to disposition. Will close encounter.    CC: Edman Circle, FNP (off today)

## 2015-08-18 NOTE — Telephone Encounter (Signed)
Telephone encounter created to discuss mychart message with patient and Dr.Silva.

## 2015-08-18 NOTE — Telephone Encounter (Signed)
Spoke with patient regarding Mallory Jimenez. Advised PUS for December 2017 has not been scheduled at this time. 6 month follow up PUS scheduled for 01/05/2016 at 9 am with 9:30 am consult with Dr.Silva. Patient states that she is still having daily lower back pain that has not worsened, but is not getting better. States Dr.Silva mentioned this could be from the larger fibroid present in her uterus. Asking if there is anything she can do for relief at this time. Advised I will speak with the physician and return call with further recommendations. She is agreeable.  Visit Follow-Up Question  Message [id number]  From DUANNE KOVER To Kem Boroughs, FNP Sent 08/17/2015 7:19 PM  Hi [name]: As you know I had the vaginal ultrasound in June. Dr. Quincy Simmonds was very nice. She explained to me that I have 3 uterine fibroids. One of these if very large. She said that might explain some of my bloating and low back pain. She discussed options with me but thought the best thing to do would be to reevaluate in 6 months (December 2017) with another [place].    So, 1. Is this [place] scheduled yet? 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 I continue to have low back discomfort, almost daily. Most of the time I just deal with it and try to reposition myself.   Do you have any recommendations on how to manage with these fibroids??   Thanks, [name]   Responsible Party   Pool - [facility] Pool No one has taken responsibility for this message.  No actions have been taken on this message.

## 2015-08-24 ENCOUNTER — Encounter: Payer: Self-pay | Admitting: Obstetrics and Gynecology

## 2015-08-24 ENCOUNTER — Ambulatory Visit (INDEPENDENT_AMBULATORY_CARE_PROVIDER_SITE_OTHER): Payer: BC Managed Care – PPO | Admitting: Obstetrics and Gynecology

## 2015-08-24 VITALS — BP 110/66 | HR 70 | Ht 67.0 in | Wt 157.0 lb

## 2015-08-24 DIAGNOSIS — Z3141 Encounter for fertility testing: Secondary | ICD-10-CM

## 2015-08-24 DIAGNOSIS — D259 Leiomyoma of uterus, unspecified: Secondary | ICD-10-CM

## 2015-08-24 NOTE — Progress Notes (Signed)
GYNECOLOGY  VISIT   HPI: 45 y.o.   Single  African American  female   [place] with Patient's last menstrual period was 07/25/2015 (exact date).   here for lower back discomfort and patient thinks this may be related to fibroids.    Does not take any medication for back pain.  Feels like she needs to readjust due to pain if she stands, drives. This is not different.   Has a large pedunculated fibroid and two smaller ones.  Ultrasound on 06/30/15 showed: Uterus with 3 fibroids:  8 mm, 14 mm, and 12 x 7 x 8 cm pedunculated right fibroid. EMS 2.24 mm.  Normal ovaries. Mild - mod free fluid in pelvis.  On combined oral contraception.  Cycle is lasting 5 days and flow is lighter.  Cramps for the first day of her menses.  Takes Ibuprofen which works well for the cramping.   Does feel bloating.   GYNECOLOGIC HISTORY: Patient's last menstrual period was 07/25/2015 (exact date). Contraception:  OCPs--[place] Menopausal hormone therapy:  none Last mammogram:  06-02-14 Density C/Neg/BiRads1:[facility] Last pap smear:   05-28-13 Neg:Neg HR HPV        OB History    Gravida Para Term Preterm AB Living   0 0 0 0 0 0   SAB TAB Ectopic Multiple Live Births   0 0 0 0           There are no active problems to display for this patient.   Past Medical History:  Diagnosis Date  . Abnormal pap    ASCUS as a teenager   . Irregular menses     Past Surgical History:  Procedure Laterality Date  . WISDOM TOOTH EXTRACTION Bilateral age 75    Current Outpatient Prescriptions  Medication Sig Dispense Refill  . aspirin EC 81 MG tablet Take 81 mg by mouth 2 (two) times a week.    Marland Kitchen b complex vitamins tablet Take 1 tablet by mouth daily.    . Biotin 1000 MCG tablet Take 1,000 mcg by mouth daily.    . Calcium Carb-Cholecalciferol (CALCIUM 600/VITAMIN D3) 600-800 MG-UNIT TABS Take 1 tablet by mouth daily.    Marland Kitchen estradiol (ESTRACE) 0.1 MG/GM vaginal cream Use 1 gm twice weekly (Patient  taking differently: as needed. Use 1 gm twice weekly) 42.5 g 3  . fluticasone (FLONASE) 50 MCG/ACT nasal spray Place 2 sprays into the nose as needed.     Marland Kitchen ibuprofen (ADVIL,MOTRIN) 100 MG chewable tablet Chew 100 mg by mouth every 8 (eight) hours as needed for fever.    Marland Kitchen levonorgestrel-ethinyl estradiol (LEVORA 0.15/30, 28,) 0.15-30 MG-MCG tablet Take 1 tablet by mouth daily. 84 tablet 4  . montelukast (SINGULAIR) 10 MG tablet Take 10 mg by mouth at bedtime.     . Prenatal Vit-Fe Fumarate-FA (MULTIVITAMIN-PRENATAL) 27-0.8 MG TABS Take 1 tablet by mouth daily at 12 noon.    . vitamin C (ASCORBIC ACID) 500 MG tablet Take 500 mg by mouth daily.    . Vitamin D, Ergocalciferol, (DRISDOL) 50000 UNITS CAPS capsule Take 1 capsule by mouth once a week.     No current facility-administered medications for this visit.      ALLERGIES: Review of patient's allergies indicates no known allergies.  Family History  Problem Relation Age of Onset  . Hypertension Mother   . Other Mother     benign abdominal tumor x 2  . Hypertension Father   . Hypertension Maternal Grandmother   . Hypertension  Paternal Grandmother   . Lymphoma Cousin 30    Non hogkins lymphoma    Social History   Social History  . Marital status: Single    Spouse name: N/A  . Number of children: 0  . Years of education: N/A   Occupational History  .  [employer]   Social History Main Topics  . Smoking status: Never Smoker  . Smokeless tobacco: Never Used  . Alcohol use No  . Drug use: No  . Sexual activity: No   Other Topics Concern  . 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    ROS:  Pertinent items are noted in HPI.  PHYSICAL EXAMINATION:    BP 110/66 (BP Location: Right Arm, Patient Position: Sitting, Cuff Size: Normal)   Pulse 70   Ht 5\' 7"  (1.702 m)   Wt 157 lb (71.2 kg)   LMP 07/25/2015 (Exact Date)   BMI 24.59 kg/m      General appearance: alert, cooperative and appears stated age   Abdomen: soft, mass palpated in pelvis that is extending to right more than left and is 15 week size but asymmetric, nontender.   Pelvic: External genitalia:  no lesions              Urethra:  normal appearing urethra with no masses, tenderness or lesions              Bartholins and Skenes: normal                 Vagina: normal appearing vagina with normal color and discharge, no lesions              Cervix: no lesions                Bimanual Exam:  Uterus:  15  Week size and asymmetric, nontender, heavy, decreased mobility.              Adnexa: no mass, fullness, tenderness              Rectal exam: Yes.  .  Confirms.              Anus:  normal sphincter tone, no lesions  Chaperone was present for exam.  ASSESSMENT  Uterine fibroids with principal fibroid that appears to be a right sided and pedundulated by ultrasound.  I believe this is likely the cause of the patient's back pain/discomfort. On combined oral contraceptives  PLAN   Discussed fibroids and laparoscopic myomectomy with morcellation of large fibroid in bag which may include a minilaparotomy. We also discussed Pfannensteil incision for either myomectomy or hysterectomy if a laparoscopic approach is not used. Consider MRI of pelvis in lieu of follow up ultrasound if observation is chosen.  Will check AMH level. I expect that Depo Lupron may have a limited effect on this large pedunculated fibroid. Not a good uterine artery embolization candidate.    An After Visit Summary was printed and given to the patient.  __25____ minutes face to face time of which over 50% was spent in counseling.

## 2015-08-27 LAB — ANTI MULLERIAN HORMONE: AMH ASSESSR: 0.23 ng/mL

## 2015-11-03 ENCOUNTER — Encounter: Payer: Self-pay | Admitting: Nurse Practitioner

## 2016-01-05 ENCOUNTER — Ambulatory Visit (INDEPENDENT_AMBULATORY_CARE_PROVIDER_SITE_OTHER): Payer: BC Managed Care – PPO | Admitting: Obstetrics and Gynecology

## 2016-01-05 ENCOUNTER — Encounter: Payer: Self-pay | Admitting: Obstetrics and Gynecology

## 2016-01-05 ENCOUNTER — Ambulatory Visit (INDEPENDENT_AMBULATORY_CARE_PROVIDER_SITE_OTHER): Payer: BC Managed Care – PPO

## 2016-01-05 ENCOUNTER — Other Ambulatory Visit: Payer: Self-pay | Admitting: Obstetrics and Gynecology

## 2016-01-05 VITALS — BP 100/64 | HR 68 | Resp 16 | Ht 67.0 in | Wt 167.0 lb

## 2016-01-05 DIAGNOSIS — D219 Benign neoplasm of connective and other soft tissue, unspecified: Secondary | ICD-10-CM

## 2016-01-05 DIAGNOSIS — D259 Leiomyoma of uterus, unspecified: Secondary | ICD-10-CM

## 2016-01-05 DIAGNOSIS — N852 Hypertrophy of uterus: Secondary | ICD-10-CM | POA: Diagnosis not present

## 2016-01-05 HISTORY — DX: Benign neoplasm of connective and other soft tissue, unspecified: D21.9

## 2016-01-05 NOTE — Progress Notes (Signed)
GYNECOLOGY  VISIT   HPI: 45 y.o.   Single  African American  female   Beatty with Patient's last menstrual period was 12/12/2015 (exact date).   here for pelvic ultrasound for 47mth f/u, PUS/consult.    Feels like she shifts and moves to relieve back pressure from fibroid? No pain medication.   Menses are monthly. Seem lighter.  Cramping for the first and second day.   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  GYNECOLOGIC HISTORY: Patient's last menstrual period was 12/12/2015 (exact date). Contraception:  Levora  Menopausal hormone therapy:  Premarin cream prn Last mammogram:  12/17 no results in yet Last pap smear:   05-28-13 neg HPV HR neg        OB History    Gravida Para Term Preterm AB Living   0 0 0 0 0 0   SAB TAB Ectopic Multiple Live Births   0 0 0 0           There are no active problems to display for this patient.   Past Medical History:  Diagnosis Date  . Abnormal pap    ASCUS as a teenager   . Irregular menses     Past Surgical History:  Procedure Laterality Date  . WISDOM TOOTH EXTRACTION Bilateral age 79    Current Outpatient Prescriptions  Medication Sig Dispense Refill  . aspirin EC 81 MG tablet Take 81 mg by mouth 2 (two) times a week.    Marland Kitchen b complex vitamins tablet Take 1 tablet by mouth daily.    . Biotin 1000 MCG tablet Take 1,000 mcg by mouth daily.    . Calcium Carb-Cholecalciferol (CALCIUM 600/VITAMIN D3) 600-800 MG-UNIT TABS Take 1 tablet by mouth daily.    Marland Kitchen estradiol (ESTRACE) 0.1 MG/GM vaginal cream Use 1 gm twice weekly (Patient taking differently: as needed. Use 1 gm twice weekly) 42.5 g 3  . fluticasone (FLONASE) 50 MCG/ACT nasal spray Place 2 sprays into the nose as needed.     Marland Kitchen ibuprofen (ADVIL,MOTRIN) 100 MG chewable tablet Chew 100 mg by mouth every 8 (eight) hours as needed for fever.    Marland Kitchen levonorgestrel-ethinyl estradiol (LEVORA 0.15/30, 28,) 0.15-30 MG-MCG tablet Take 1 tablet by mouth daily. 84 tablet 4  .  montelukast (SINGULAIR) 10 MG tablet Take 10 mg by mouth at bedtime.     . Prenatal Vit-Fe Fumarate-FA (MULTIVITAMIN-PRENATAL) 27-0.8 MG TABS Take 1 tablet by mouth daily at 12 noon.    . vitamin C (ASCORBIC ACID) 500 MG tablet Take 500 mg by mouth daily.    . Vitamin D, Ergocalciferol, (DRISDOL) 50000 UNITS CAPS capsule Take 1 capsule by mouth once a week.     No current facility-administered medications for this visit.      ALLERGIES: Patient has no known allergies.  Family History  Problem Relation Age of Onset  . Hypertension Mother   . Other Mother     benign abdominal tumor x 2  . Hypertension Father   . Hypertension Maternal Grandmother   . Hypertension Paternal Grandmother   . Lymphoma Cousin 30    Non hogkins lymphoma    Social History   Social History  . Marital status: Single    Spouse name: N/A  . Number of children: 0  . Years of education: N/A   Occupational History  .  [employer]   Social History Main Topics  . Smoking status: Never Smoker  . Smokeless tobacco: Never Used  . Alcohol  use No  . Drug use: No  . Sexual activity: No   Other Topics Concern  . 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    ROS:  Pertinent items are noted in HPI.  PHYSICAL EXAMINATION:    BP 100/64   Pulse 68   Resp 16   Ht 5\' 7"  (1.702 m)   Wt 167 lb (75.8 kg)   LMP 12/12/2015 (Exact Date)   BMI 26.16 kg/m     General appearance: alert, cooperative and appears stated age    Pelvic ultrasound: 3 fibroids - 0.9 cmintramural 0.8 cm intramural, 9.6 cm pedunculated attached to right fundus. - no significant change. EMS 5.01 mm.  Normal ovaries. No free fluid.   ASSESSMENT  Stable uterine fibroids with one large pedunculated fibroid.    PLAN  Fibroids discussed in general.  Reviewed observation versus laparoscopy with myomectomy of pedunculated fibroid versus hysterectomy.    Will do observation for now.  Patient will do yearly ultrasound and sooner as needed for symptoms of pain or bleeding.  Keep annual exam appt with [facility].  An After Visit Summary was printed and given to the patient.  ___15___ minutes face to face time of which over 50% was spent in counseling.

## 2016-06-08 ENCOUNTER — Encounter: Payer: Self-pay | Admitting: Nurse Practitioner

## 2016-06-08 ENCOUNTER — Ambulatory Visit (INDEPENDENT_AMBULATORY_CARE_PROVIDER_SITE_OTHER): Payer: BC Managed Care – PPO | Admitting: Nurse Practitioner

## 2016-06-08 VITALS — BP 116/74 | HR 64 | Ht 67.0 in | Wt 163.0 lb

## 2016-06-08 DIAGNOSIS — D259 Leiomyoma of uterus, unspecified: Secondary | ICD-10-CM

## 2016-06-08 DIAGNOSIS — Z01411 Encounter for gynecological examination (general) (routine) with abnormal findings: Secondary | ICD-10-CM | POA: Diagnosis not present

## 2016-06-08 DIAGNOSIS — Z0000 Encounter for general adult medical examination without abnormal findings: Secondary | ICD-10-CM | POA: Diagnosis not present

## 2016-06-08 MED ORDER — LEVONORGESTREL-ETHINYL ESTRAD 0.15-30 MG-MCG PO TABS
1.0000 | ORAL_TABLET | Freq: Every day | ORAL | 4 refills | Status: DC
Start: 2016-06-08 — End: 2017-06-14

## 2016-06-08 NOTE — Progress Notes (Signed)
Encounter reviewed by Dr. Lielle Vandervort Amundson C. Silva.  

## 2016-06-08 NOTE — Progress Notes (Signed)
Patient ID: Mallory Jimenez, female   DOB: Apr 08, 1970, 46 y.o.   MRN: [id number]  46 y.o. [id number] Single  African American Fe here for annual exam.  No new health problems.   Menses is moderate to light on OCP.  Still has a feeling of pelvic "fulness" from her fibroids.  Last PUS 12/17 shows a pedunculated fibroid on the right that is 9.6 cm.  Her AMH was also low at 0.23.  She is now considering more seriously surgical options.  She would like to return for another PUS in the fall and go from there instead of waiting until December.  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  Patient's last menstrual period was 05/30/2016 (exact date).          Sexually active: No.  The current method of family planning is OCP (estrogen/progesterone).    Exercising: No.  The patient does not participate in regular exercise at present. Smoker:  no  Health Maintenance: Pap: 06/07/13, Negative with neg HR HPV  05/18/11, Negative History of Abnormal Pap: yes, ASCUS as teenager MMG: 12/27/15, 3D-no, Density Category C, Bi-Rads 1:  Negative Self Breast exams: sometimes TDaP: 01/30/07 HIV: 06/06/15 Labs: PCP, copy provided by patient    reports that she has never smoked. She has never used smokeless tobacco. She reports that she does not drink alcohol or use drugs.  Past Medical History:  Diagnosis Date  . Abnormal pap    ASCUS as a teenager   . Fibroid 01/05/2016   3 fibroids with one large pedunculated fibroid.  Ultrasound yearly.  . Irregular menses     Past Surgical History:  Procedure Laterality Date  . WISDOM TOOTH EXTRACTION Bilateral age 44    Current Outpatient Prescriptions  Medication Sig Dispense Refill  . aspirin EC 81 MG tablet Take 81 mg by mouth 2 (two) times a week.    Marland Kitchen b complex vitamins tablet Take 1 tablet by mouth daily.    . Biotin 1000 MCG tablet Take 1,000 mcg by mouth daily.    . Calcium Carb-Cholecalciferol (CALCIUM 600/VITAMIN D3) 600-800 MG-UNIT TABS Take 1 tablet by mouth  daily.    Marland Kitchen estradiol (ESTRACE) 0.1 MG/GM vaginal cream Use 1 gm twice weekly (Patient taking differently: as needed. Use 1 gm twice weekly) 42.5 g 3  . fluticasone (FLONASE) 50 MCG/ACT nasal spray Place 2 sprays into the nose as needed.     Marland Kitchen ibuprofen (ADVIL,MOTRIN) 100 MG chewable tablet Chew 100 mg by mouth every 8 (eight) hours as needed for fever.    Marland Kitchen levonorgestrel-ethinyl estradiol (LEVORA 0.15/30, 28,) 0.15-30 MG-MCG tablet Take 1 tablet by mouth daily. 84 tablet 4  . montelukast (SINGULAIR) 10 MG tablet Take 10 mg by mouth at bedtime.     . Prenatal Vit-Fe Fumarate-FA (MULTIVITAMIN-PRENATAL) 27-0.8 MG TABS Take 1 tablet by mouth daily at 12 noon.    . vitamin C (ASCORBIC ACID) 500 MG tablet Take 500 mg by mouth daily.    . Vitamin D, Ergocalciferol, (DRISDOL) 50000 UNITS CAPS capsule Take 1 capsule by mouth once a week.     No current facility-administered medications for this visit.     Family History  Problem Relation Age of Onset  . Hypertension Mother   . Other Mother        benign abdominal tumor x 2  . Hypertension Father   . Hypertension Maternal Grandmother   . Hypertension Paternal Grandmother   . Lymphoma Cousin 30  Non hogkins lymphoma    ROS:  Pertinent items are noted in HPI.  Otherwise, a comprehensive ROS was negative.  Exam:   BP 116/74 (BP Location: Right Arm, Patient Position: Sitting, Cuff Size: Normal)   Pulse 64   Ht 5\' 7"  (1.702 m)   Wt 163 lb (73.9 kg)   LMP 05/30/2016 (Exact Date)   BMI 25.53 kg/m  Height: 5\' 7"  (170.2 cm) Ht Readings from Last 3 Encounters:  06/08/16 5\' 7"  (1.702 m)  01/05/16 5\' 7"  (1.702 m)  08/24/15 5\' 7"  (1.702 m)    General appearance: alert, cooperative and appears stated age Head: Normocephalic, without obvious abnormality, atraumatic Neck: no adenopathy, supple, symmetrical, trachea midline and thyroid normal to inspection and palpation Lungs: clear to auscultation bilaterally Breasts: normal appearance, no  masses or tenderness Heart: regular rate and rhythm Abdomen: soft, non-tender; no masses,  no organomegaly Extremities: extremities normal, atraumatic, no cyanosis or edema Skin: Skin color, texture, turgor normal. No rashes or lesions Lymph nodes: Cervical, supraclavicular, and axillary nodes normal. No abnormal inguinal nodes palpated Neurologic: Grossly normal   Pelvic: External genitalia:  no lesions              Urethra:  normal appearing urethra with no masses, tenderness or lesions              Bartholin's and Skene's: normal                 Vagina: normal appearing vagina with normal color and discharge, no lesions              Cervix: anteverted              Pap taken: Yes.   Bimanual Exam:  Uterus:  enlarged, 8-10 weeks size              Adnexa: positive for: enlargement and fullness               Rectovaginal: Confirms               Anus:  normal sphincter tone, no lesions  Chaperone present: yes  A:  Well Woman with normal exam   OCP for cycle regulation Not SA since about 2009 History of Vit D deficiency             Enlarged uterus with pedunculated Fibroid 9.6 cm   P:   Reviewed health and wellness pertinent to exam  Pap smear: yes  Mammogram is due 12/18  Refill on OCP for a year  Will schedule PUS for October and follow  Counseled on breast self exam, mammography screening, use and side effects of OCP's, adequate intake of calcium and vitamin D, diet and exercise return annually or prn  An After Visit Summary was printed and given to the patient.

## 2016-06-08 NOTE — Patient Instructions (Addendum)

## 2016-06-12 LAB — IPS PAP TEST WITH HPV

## 2016-06-14 ENCOUNTER — Encounter: Payer: Self-pay | Admitting: Nurse Practitioner

## 2016-06-29 ENCOUNTER — Other Ambulatory Visit: Payer: Self-pay | Admitting: Nurse Practitioner

## 2016-08-24 ENCOUNTER — Telehealth: Payer: Self-pay | Admitting: Obstetrics and Gynecology

## 2016-08-24 DIAGNOSIS — D259 Leiomyoma of uterus, unspecified: Secondary | ICD-10-CM

## 2016-08-24 NOTE — Telephone Encounter (Signed)
Patient says she thought she needed another ultrasound to check her fibroids.

## 2016-08-27 NOTE — Telephone Encounter (Signed)
Spoke with patient. Scheduled for PUS 10/25/16 at 8:30 am with consult to follow at 9am with Dr. Quincy Simmonds. Patient states she has had some increased back pain that she is seeing physical therapy for and reports less bleeding with cycles on OCP. Advised patient would update Dr. Quincy Simmonds and return call with any additional recommendations. Patient verbalizes understanding and is agreeable.  New order placed for PUS.  Routing to provider for final review. Patient is agreeable to disposition. Will close encounter.  Cc: Lerry Liner

## 2016-10-25 ENCOUNTER — Ambulatory Visit (INDEPENDENT_AMBULATORY_CARE_PROVIDER_SITE_OTHER): Payer: BC Managed Care – PPO | Admitting: Obstetrics and Gynecology

## 2016-10-25 ENCOUNTER — Ambulatory Visit (INDEPENDENT_AMBULATORY_CARE_PROVIDER_SITE_OTHER): Payer: BC Managed Care – PPO

## 2016-10-25 ENCOUNTER — Telehealth: Payer: Self-pay | Admitting: *Deleted

## 2016-10-25 ENCOUNTER — Encounter: Payer: Self-pay | Admitting: Obstetrics and Gynecology

## 2016-10-25 VITALS — BP 112/60 | HR 60 | Ht 67.0 in | Wt 160.0 lb

## 2016-10-25 DIAGNOSIS — D259 Leiomyoma of uterus, unspecified: Secondary | ICD-10-CM

## 2016-10-25 DIAGNOSIS — D219 Benign neoplasm of connective and other soft tissue, unspecified: Secondary | ICD-10-CM | POA: Diagnosis not present

## 2016-10-25 HISTORY — DX: Leiomyoma of uterus, unspecified: D25.9

## 2016-10-25 NOTE — Progress Notes (Signed)
Patient ID: Mallory Jimenez, female   DOB: 04-Jan-1971, 46 y.o.   MRN: [id number] GYNECOLOGY  VISIT   HPI: 46 y.o.   Single  African American  female   [place] with Patient's last menstrual period was 10/17/2016 (approximate).   here for pelvic ultrasound to follow up on fibroid.   Feeling a little more fullness in the lower abdomen.  Did some PT for low back pain, which has helped.   Menses are fine but changing.  Pad change 3 - 4 times a day at the worse flow.  Cramping and nausea more significant the first day.  Manages pain with NSAIDs.  AMH 0.23 on 08/24/15.  GYNECOLOGIC HISTORY: Patient's last menstrual period was 10/17/2016 (approximate). Contraception: OCPs--Nordette Menopausal hormone therapy:  Estrace vaginal cream Last mammogram: 12-27-15 Density C/Neg/BiRads1:[facility] Last pap smear: 06-08-16 Neg:Neg HR HPV                             05-28-13 Neg:Neg HR HPV        OB History    Gravida Para Term Preterm AB Living   0 0 0 0 0 0   SAB TAB Ectopic Multiple Live Births   0 0 0 0 0         There are no active problems to display for this patient.   Past Medical History:  Diagnosis Date  . Abnormal pap    ASCUS as a teenager   . Fibroid 01/05/2016   3 fibroids with one large pedunculated fibroid.  Ultrasound yearly.  . Irregular menses     Past Surgical History:  Procedure Laterality Date  . WISDOM TOOTH EXTRACTION Bilateral age 61    Current Outpatient Prescriptions  Medication Sig Dispense Refill  . aspirin EC 81 MG tablet Take 81 mg by mouth 2 (two) times a week.    Marland Kitchen b complex vitamins tablet Take 1 tablet by mouth daily.    . Biotin 1000 MCG tablet Take 1,000 mcg by mouth as needed.     . Calcium Carb-Cholecalciferol (CALCIUM 600/VITAMIN D3) 600-800 MG-UNIT TABS Take 1 tablet by mouth daily.    Marland Kitchen estradiol (ESTRACE) 0.1 MG/GM vaginal cream Use 1 gm twice weekly (Patient taking differently: as needed. Use 1 gm twice weekly) 42.5 g 3  . fluticasone  (FLONASE) 50 MCG/ACT nasal spray Place 2 sprays into the nose as needed.     Marland Kitchen ibuprofen (ADVIL,MOTRIN) 100 MG chewable tablet Chew 100 mg by mouth every 8 (eight) hours as needed for fever.    Marland Kitchen levonorgestrel-ethinyl estradiol (LEVORA 0.15/30, 28,) 0.15-30 MG-MCG tablet Take 1 tablet by mouth daily. 84 tablet 4  . montelukast (SINGULAIR) 10 MG tablet Take 10 mg by mouth at bedtime.     . Prenatal Vit-Fe Fumarate-FA (MULTIVITAMIN-PRENATAL) 27-0.8 MG TABS Take 1 tablet by mouth daily at 12 noon.    . SUMAtriptan (IMITREX) 25 MG tablet Take 1 tablet by mouth as needed.  0  . vitamin C (ASCORBIC ACID) 500 MG tablet Take 500 mg by mouth as needed.     . Vitamin D, Ergocalciferol, (DRISDOL) 50000 UNITS CAPS capsule Take 1 capsule by mouth once a week.     No current facility-administered medications for this visit.      ALLERGIES: Patient has no known allergies.  Family History  Problem Relation Age of Onset  . Hypertension Mother   . Other Mother  benign abdominal tumor x 2  . Hypertension Father   . Hypertension Maternal Grandmother   . Hypertension Paternal Grandmother   . Lymphoma Cousin 30       Non hogkins lymphoma    Social History   Social History  . Marital status: Single    Spouse name: N/A  . Number of children: 0  . Years of education: N/A   Occupational History  .  [employer]   Social History Main Topics  . Smoking status: Never Smoker  . Smokeless tobacco: Never Used  . Alcohol use No  . Drug use: No  . Sexual activity: No     Comment: Nordette   Other Topics Concern  . 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    ROS:  Pertinent items are noted in HPI.  PHYSICAL EXAMINATION:    BP 112/60 (BP Location: Right Arm, Patient Position: Sitting, Cuff Size: Normal)   Pulse 60   Ht 5\' 7"  (1.702 m)   Wt 160 lb (72.6 kg)   LMP 10/17/2016 (Approximate)   BMI 25.06  kg/m     General appearance: alert, cooperative and appears stated age   Pelvic: External genitalia:  no lesions              Urethra:  normal appearing urethra with no masses, tenderness or lesions              Bartholins and Skenes: normal                 Vagina: normal appearing vagina with normal color and discharge, no lesions              Cervix: no lesions             Bimanual Exam:  Uterus:  enlarged, 16 weeks size.  Uterus fills pelvis and fibroid surrounds the LUS just above the cervix.              Adnexa: no mass, fullness, tenderness and difficult to feel separately from the uterus.           Pelvic ultrasound: 3 fibroids:  1 cm, 1 cm, and 10 cm pedunculated fibroid Large vascular area seen inferior to the bladder and anterior to the fibroid, representing pedunculated fibroid. EMS 6.74 mm. Normal ovaries.  Small amount fluid in cul de sac.   Chaperone was present for exam.  ASSESSMENT  Large fibroid uterus.   Increased vascularity of the fibroid which may represent the pedunculated portion of the fibroid.  Low AMH.   PLAN  I am recommending MRI of the pelvis for further definition of the fibroid and vascular area near the bladder.  I have indicated that I think surgical care is needed which may include hysterectomy.  Myomectomy may or may not be an option for care.  She may be a candidate for Depo Lupron therapy.  After the MRI is back, I will understand better if she may need a urology or [facility] consultation. She is aware of this.    An After Visit Summary was printed and given to the patient.  ___25___ minutes face to face time of which over 50% was spent in counseling.

## 2016-10-25 NOTE — Telephone Encounter (Signed)
Thanks for the update

## 2016-10-25 NOTE — Patient Instructions (Signed)
Leuprolide depot injection What is this medicine? LEUPROLIDE (loo PROE lide) is a man-made protein that acts like a natural hormone in the body. It decreases testosterone in men and decreases estrogen in women. In men, this medicine is used to treat advanced prostate cancer. In women, some forms of this medicine may be used to treat endometriosis, uterine fibroids, or other female hormone-related problems. This medicine may be used for other purposes; ask your health care provider or pharmacist if you have questions. COMMON BRAND NAME(S): Eligard, Lupron Depot, Lupron Depot-Ped, Viadur What should I tell my health care provider before I take this medicine? They need to know if you have any of these conditions: -diabetes -heart disease or previous heart attack -high blood pressure -high cholesterol -mental illness -osteoporosis -pain or difficulty passing urine -seizures -spinal cord metastasis -stroke -suicidal thoughts, plans, or attempt; a previous suicide attempt by you or a family member -tobacco smoker -unusual vaginal bleeding (women) -an unusual or allergic reaction to leuprolide, benzyl alcohol, other medicines, foods, dyes, or preservatives -pregnant or trying to get pregnant -breast-feeding How should I use this medicine? This medicine is for injection into a muscle or for injection under the skin. It is given by a health care professional in a hospital or clinic setting. The specific product will determine how it will be given to you. Make sure you understand which product you receive and how often you will receive it. Talk to your pediatrician regarding the use of this medicine in children. Special care may be needed. Overdosage: If you think you have taken too much of this medicine contact a poison control center or emergency room at once. NOTE: This medicine is only for you. Do not share this medicine with others. What if I miss a dose? It is important not to miss a dose.  Call your doctor or health care professional if you are unable to keep an appointment. Depot injections: Depot injections are given either once-monthly, every 12 weeks, every 16 weeks, or every 24 weeks depending on the product you are prescribed. The product you are prescribed will be based on if you are female or female, and your condition. Make sure you understand your product and dosing. What may interact with this medicine? Do not take this medicine with any of the following medications: -chasteberry This medicine may also interact with the following medications: -herbal or dietary supplements, like black cohosh or DHEA -female hormones, like estrogens or progestins and birth control pills, patches, rings, or injections -female hormones, like testosterone This list may not describe all possible interactions. Give your health care provider a list of all the medicines, herbs, non-prescription drugs, or dietary supplements you use. Also tell them if you smoke, drink alcohol, or use illegal drugs. Some items may interact with your medicine. What should I watch for while using this medicine? Visit your doctor or health care professional for regular checks on your progress. During the first weeks of treatment, your symptoms may get worse, but then will improve as you continue your treatment. You may get hot flashes, increased bone pain, increased difficulty passing urine, or an aggravation of nerve symptoms. Discuss these effects with your doctor or health care professional, some of them may improve with continued use of this medicine. Female patients may experience a menstrual cycle or spotting during the first months of therapy with this medicine. If this continues, contact your doctor or health care professional. What side effects may I notice from receiving this medicine? Side   effects that you should report to your doctor or health care professional as soon as possible: -allergic reactions like skin  rash, itching or hives, swelling of the face, lips, or tongue -breathing problems -chest pain -depression or memory disorders -pain in your legs or groin -pain at site where injected or implanted -seizures -severe headache -swelling of the feet and legs -suicidal thoughts or other mood changes -visual changes -vomiting Side effects that usually do not require medical attention (report to your doctor or health care professional if they continue or are bothersome): -breast swelling or tenderness -decrease in sex drive or performance -diarrhea -hot flashes -loss of appetite -muscle, joint, or bone pains -nausea -redness or irritation at site where injected or implanted -skin problems or acne This list may not describe all possible side effects. Call your doctor for medical advice about side effects. You may report side effects to FDA at 1-800-FDA-1088. Where should I keep my medicine? This drug is given in a hospital or clinic and will not be stored at home. NOTE: This sheet is a summary. It may not cover all possible information. If you have questions about this medicine, talk to your doctor, pharmacist, or health care provider.  2018 Elsevier/Gold Standard (2015-06-23 09:45:53)  

## 2016-10-25 NOTE — Progress Notes (Signed)
Encounter reviewed by Dr. Tiki Tucciarone Amundson C. Silva.  

## 2016-10-25 NOTE — Progress Notes (Signed)
See telephone encounter dated 10/25/16 in regard to MRI of pelvis.

## 2016-10-25 NOTE — Telephone Encounter (Signed)
Spoke with Mardene Celeste at [facility], called to schedule MR Pelvis with/without contrast. Was advised [facility] will call patient directly to schedule, confirmed order.   Routing to [place] D. For precert.  CC: Dr. Quincy Simmonds

## 2016-10-25 NOTE — Telephone Encounter (Signed)
Spoke with patient, advised [facility] would be contacting her directly to schedule MRI of pelvis. Patient verbalizes understanding and is agreeable.

## 2016-10-30 NOTE — Telephone Encounter (Signed)
Per review of EPIC, patient scheduled for MRI of pelvis on 11/09/16 at 4pm with [facility].  Routing to provider for final review.Will close encounter.

## 2016-11-07 ENCOUNTER — Ambulatory Visit
Admission: RE | Admit: 2016-11-07 | Discharge: 2016-11-07 | Disposition: A | Discharge status: Home / Self Care | Payer: BC Managed Care – PPO | Source: Ambulatory Visit | Attending: Obstetrics and Gynecology | Admitting: Obstetrics and Gynecology

## 2016-11-07 DIAGNOSIS — D219 Benign neoplasm of connective and other soft tissue, unspecified: Secondary | ICD-10-CM

## 2016-11-07 MED ORDER — GADOBENATE DIMEGLUMINE 529 MG/ML IV SOLN
15.0000 mL | Freq: Once | INTRAVENOUS | Status: AC | PRN
  Administered 2016-11-07: 15 mL via INTRAVENOUS

## 2016-11-09 ENCOUNTER — Other Ambulatory Visit: Payer: Self-pay

## 2016-11-09 ENCOUNTER — Telehealth: Payer: Self-pay | Admitting: *Deleted

## 2016-11-09 ENCOUNTER — Other Ambulatory Visit: Payer: Self-pay | Admitting: Obstetrics and Gynecology

## 2016-11-09 DIAGNOSIS — D219 Benign neoplasm of connective and other soft tissue, unspecified: Secondary | ICD-10-CM

## 2016-11-09 NOTE — Telephone Encounter (Signed)
Spoke with patient, advised as seen below per Dr. Quincy Simmonds. Patient verbalizes understanding and is agreeable, thankful for f/u.   PA faxed to [facility] at [phone]

## 2016-11-09 NOTE — Telephone Encounter (Signed)
I just reviewed the MRI with the radiologist.  The pedicle for the fibroid where it attaches to the uterus is quite broad and has a rich blood supply.    I cannot guarantee that myomectomy is possible.  Myomectomy itself is a risk for transfusion and hysterectomy.  This may be something that needs to be assessed at the time of surgery with the idea the hysterectomy could be an expected outcome.

## 2016-11-09 NOTE — Telephone Encounter (Signed)
Spoke with patient, advised as seen below per Dr. Quincy Simmonds.  Advised patient will start process for approval of Depo Lupron, will provide updates as received. Gay Filler will f/u with surgery planning.   Patient has additional questions about frequency of Lupron and side effects. Advised patient will review with Dr. Quincy Simmonds and return call. Patient verbalizes understanding and is agreeable.    Dr. Quincy Simmonds -please review and advise? Lupron referral form placed on desk for completion and signature.   Cc: Lamont Snowball, RN

## 2016-11-09 NOTE — Telephone Encounter (Signed)
I am recommending Depo Lupron 11.25 mg IM every 3 months.  She will need at least one injection with the idea that a second one is helpful if there is significant response to the first injection.  She will need a pelvic ultrasound about 10 weeks after her first injection.  Main side effects are hot flashes, vaginal dryness and absent menses. She may have some vaginal bleeding soon after the injection and then would expect to have her menses stop completely.  I hope this answers her questions.

## 2016-11-09 NOTE — Telephone Encounter (Signed)
Spoke with patient, advised as seen below per Dr. Quincy Simmonds. Advised if agreeable, will start precert process for Depo Lupron.   Patient states the possibility of myomectomy was discussed at [place], is this still an option vs hysterectomy? Recommended OV for further discussion with Dr. Quincy Simmonds, patient declined at this time. Would like to know if this is still an option, will schedule OV to discuss depending on recommendation.   Advised patient will review with Dr. Quincy Simmonds and return call, patient is agreeable.   Dr. Quincy Simmonds -please advise?       Notes recorded by Nunzio Cobbs, MD on 11/09/2016 at 6:37 AM EDT Please contact the patient about her MRI showing the large pedunculated fibroid measuring 15 cm in the largest diameter. She has 2 smaller fibroids measuring 1 - 2 cm. Her ovaries look normal. Her bladder looks normal.   I am recommending Depo Lupron for 3 - 6 months and proceeding forward with hysterectomy planning.  We discussed these possibilities at her office visit.  The Depo Lupron will need to be precerted.

## 2016-11-13 NOTE — Telephone Encounter (Signed)
PA request for Lupron received via fax from [place]. PA completed and placed on Dr. Elza Rafter desk for signature.

## 2016-11-14 NOTE — Telephone Encounter (Signed)
PA completed and signed, faxed to [place].

## 2016-11-16 NOTE — Telephone Encounter (Signed)
PA received for Lupron. [facility] request RX be faxed to [phone] with copy of PA.   Written RX to Dr. Quincy Simmonds for signature.   Spoke with patient, advised PA for Lupron has been approved for 11/14/16 -02/14/17. [facility] will be [facility] you directly to review benefits and authorize shipment to office. LMP 11/15/16.   Will then schedule Lupron injection with next menses.   Patient verbalizes understanding and is agreeable.

## 2016-11-16 NOTE — Telephone Encounter (Signed)
Written RX for Lupron faxed to [facility] [phone], with copy of PA as requested.

## 2016-11-28 NOTE — Telephone Encounter (Signed)
Left message to call Keta [place] at [phone].  

## 2016-11-29 NOTE — Telephone Encounter (Signed)
Spoke with Desiree at [place]. Calling to verify if Lupron has been delivered to office. Advised Lupron has not been shipped to [facility]. Awaiting return call from patient, will f[facility] with [facility].

## 2016-11-29 NOTE — Telephone Encounter (Signed)
Mali with Lupron wants med status update on Lupron delivery for patient.

## 2016-11-30 NOTE — Telephone Encounter (Signed)
Will close encounter

## 2016-11-30 NOTE — Telephone Encounter (Signed)
Spoke with patient. Patient states she has been in contact with [facility] and [employer] regarding Lupron. Patient is in the process of completing application to determine financial assistance for Lupron. Has a form for Dr. Quincy Simmonds to complete and fax to Dina Rich, will bring form to the office on 12/03/16. Patient aware to call with questions/assistance.  Routing to Dr. Antony Blackbird.

## 2016-11-30 NOTE — Telephone Encounter (Signed)
Thank you for the update.  I think we should close this encounter.

## 2016-12-25 ENCOUNTER — Telehealth: Payer: Self-pay | Admitting: *Deleted

## 2016-12-25 NOTE — Telephone Encounter (Signed)
Call has been placed to Lupron rep to check on any other patient assistance program for medication. Rep will call back after checking on options.

## 2016-12-25 NOTE — Telephone Encounter (Signed)
Spoke with patient. Patient states she has not received any f/u from [place] regarding patient assistance application for Lupron. Advised patient requested form faxed to [employer] on 12/05/16.   Patient states she was made aware she may not qualify for assistance, out of pocket cost approximately $700, requesting alternative to Lupron d/t cost.   Patient states she has also discussed "freezing eggs" with Dr. Quincy Simmonds and would like to know when would be a good time to do this during this process?   Advised patient will review with Dr. Quincy Simmonds and return call with recommendations. Patient is going to f/u with [employer] regarding assistance application. Patient aware Dr. Quincy Simmonds is out of the office today, response may not be immediate.   Dr. Quincy Simmonds -please review and advise?  Cc: Lamont Snowball, RN

## 2016-12-26 NOTE — Telephone Encounter (Signed)
Spoke with patient, advised as seen below per Dr. Quincy Simmonds. Patient verbalizes understanding and is agreeable.   Advised patient awaiting feedback for options for assistance with Lupron medication. Myself or Gay Filler will return call when more information received.   Patient thankful for f/u and is agreeable.

## 2016-12-26 NOTE — Telephone Encounter (Signed)
Routing to S. Yeakley, RN.  

## 2016-12-26 NOTE — Telephone Encounter (Signed)
Her AMH level was low last year, which indicates low possibility for fertility.  I am not recommending freezing her eggs after having this result for the [facility].  The Depo Lupron also does stop ovarian function, so egg freezing and Depo Lupron are two treatments which are not compatible with one another.  I hope this clarifies.  I am sorry if there were some confusion.

## 2017-01-01 ENCOUNTER — Encounter: Payer: Self-pay | Admitting: Obstetrics and Gynecology

## 2017-01-02 ENCOUNTER — Telehealth: Payer: Self-pay | Admitting: *Deleted

## 2017-01-02 NOTE — Telephone Encounter (Signed)
See telephone encounter dated 01/02/17.

## 2017-01-02 NOTE — Telephone Encounter (Signed)
Left message to call Sharee Pimple at [phone].   Non-Urgent Medical Question  Message [phone]  From Phil Campbell To Nunzio Cobbs, MD Sent 01/01/2017 7:30 AM  I would like to schedule an appointment with Dr. Quincy Simmonds to discuss my options and plans again. I have some additional questions and have gained some additional information (self investigating) and need some clarity on my diagnosis. Thanks, [employer]

## 2017-01-02 NOTE — Telephone Encounter (Signed)
See next phone encounter. Encounter closed.

## 2017-01-03 NOTE — Telephone Encounter (Signed)
Spoke with patient. Patient states that she would like to return to the office to discuss her fibroids and plan again with Dr.Silva. Aware Dr.Silva will be out of the office until January. Patient would like to wait until January to be seen with Dr.Silva. Appointment scheduled for 02/11/2017 at 12:45 pm with Dr.Silva. Declines all earlier appointments due to travel. Aware if she would like to be seen earlier we can set her up to see a covering MD.   Patient also asking about Lupron. States Sharee Pimple or Gay Filler were going to speak with [employer] about Lupron assistance. Advised will send a message to Sharee Pimple and Gay Filler and have someone return her call with further information.

## 2017-01-03 NOTE — Telephone Encounter (Signed)
Return call to patient. Lupron is cost prohibitive to patient and samples are an option but would require monthly application process and therefore uncertainty of availabilty. Have reviewed Lupron issue with Dr Quincy Simmonds who recommended consult to discuss pros/cons of starting Lupron therapy versus proceeding with alternatives. Patient would like to hold on Lupron for now till she can meet with Dr Quincy Simmonds to discuss options.   Appointment scheduled with Dr Quincy Simmonds on 02-11-17. She is aware Dr Quincy Simmonds currently on LOA and that if she has acute need, Dr Sabra Heck or Dr Talbert Nan are happy to see her.  Routing to provider for final review. Patient agreeable to disposition. Will close encounter.    Routing to Dr Sabra Heck, covering for Dr Quincy Simmonds.

## 2017-02-06 NOTE — Progress Notes (Deleted)
GYNECOLOGY  VISIT   HPI: 47 y.o.   Single  African American  female   [id number] with No LMP recorded.   here for     GYNECOLOGIC HISTORY: No LMP recorded. Contraception:  ***?Nordette Menopausal hormone therapy:  Estrace vaginal cream Last mammogram: ***?12-27-15 Density C/Neg/BiRads1:[facility] Last pap smear:  06-08-16 Neg:Neg HR HPV                             05-28-13 Neg:Neg HR HPV        OB History    Gravida Para Term Preterm AB Living   0 0 0 0 0 0   SAB TAB Ectopic Multiple Live Births   0 0 0 0 0         Patient Active Problem List   Diagnosis Date Noted  . Fibroid uterus 10/25/2016    Past Medical History:  Diagnosis Date  . Abnormal pap    ASCUS as a teenager   . Fibroid 01/05/2016   3 fibroids with one large pedunculated fibroid.  Ultrasound yearly.  . Irregular menses     Past Surgical History:  Procedure Laterality Date  . WISDOM TOOTH EXTRACTION Bilateral age 55    Current Outpatient Medications  Medication Sig Dispense Refill  . aspirin EC 81 MG tablet Take 81 mg by mouth 2 (two) times a week.    Marland Kitchen b complex vitamins tablet Take 1 tablet by mouth daily.    . Biotin 1000 MCG tablet Take 1,000 mcg by mouth as needed.     . Calcium Carb-Cholecalciferol (CALCIUM 600/VITAMIN D3) 600-800 MG-UNIT TABS Take 1 tablet by mouth daily.    Marland Kitchen estradiol (ESTRACE) 0.1 MG/GM vaginal cream Use 1 gm twice weekly (Patient taking differently: as needed. Use 1 gm twice weekly) 42.5 g 3  . fluticasone (FLONASE) 50 MCG/ACT nasal spray Place 2 sprays into the nose as needed.     Marland Kitchen ibuprofen (ADVIL,MOTRIN) 100 MG chewable tablet Chew 100 mg by mouth every 8 (eight) hours as needed for fever.    Marland Kitchen levonorgestrel-ethinyl estradiol (LEVORA 0.15/30, 28,) 0.15-30 MG-MCG tablet Take 1 tablet by mouth daily. 84 tablet 4  . montelukast (SINGULAIR) 10 MG tablet Take 10 mg by mouth at bedtime.     . Prenatal Vit-Fe Fumarate-FA (MULTIVITAMIN-PRENATAL) 27-0.8 MG TABS Take 1 tablet by  mouth daily at 12 noon.    . SUMAtriptan (IMITREX) 25 MG tablet Take 1 tablet by mouth as needed.  0  . vitamin C (ASCORBIC ACID) 500 MG tablet Take 500 mg by mouth as needed.     . Vitamin D, Ergocalciferol, (DRISDOL) 50000 UNITS CAPS capsule Take 1 capsule by mouth once a week.     No current facility-administered medications for this visit.      ALLERGIES: Patient has no known allergies.  Family History  Problem Relation Age of Onset  . Hypertension Mother   . Other Mother        benign abdominal tumor x 2  . Hypertension Father   . Hypertension Maternal Grandmother   . Hypertension Paternal Grandmother   . Lymphoma Cousin 30       Non hogkins lymphoma    Social History   Socioeconomic History  . Marital status: Single    Spouse name: Not on file  . Number of children: 0  . Years of education: Not on file  . Highest education level: Not on file  Social  Needs  . Financial resource strain: Not on file  . Food insecurity - worry: Not on file  . Food insecurity - inability: Not on file  . Transportation needs - medical: Not on file  . Transportation needs - non-medical: Not on file  Occupational History    Employer: [employer]  Tobacco Use  . Smoking status: Never Smoker  . Smokeless tobacco: Never Used  Substance and Sexual Activity  . Alcohol use: No  . Drug use: No  . Sexual activity: No    Partners: Male    Birth control/protection: Pill    Comment: Nordette  Other Topics Concern  . 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    ROS:  Pertinent items are noted in HPI.  PHYSICAL EXAMINATION:    There were no vitals taken for this visit.    General appearance: alert, cooperative and appears stated age Head: Normocephalic, without obvious abnormality, atraumatic Neck: no adenopathy, supple, symmetrical, trachea midline and thyroid normal to inspection and palpation Lungs:  clear to auscultation bilaterally Breasts: normal appearance, no masses or tenderness, No nipple retraction or dimpling, No nipple discharge or bleeding, No axillary or supraclavicular adenopathy Heart: regular rate and rhythm Abdomen: soft, non-tender, no masses,  no organomegaly Extremities: extremities normal, atraumatic, no cyanosis or edema Skin: Skin color, texture, turgor normal. No rashes or lesions Lymph nodes: Cervical, supraclavicular, and axillary nodes normal. No abnormal inguinal nodes palpated Neurologic: Grossly normal  Pelvic: External genitalia:  no lesions              Urethra:  normal appearing urethra with no masses, tenderness or lesions              Bartholins and Skenes: normal                 Vagina: normal appearing vagina with normal color and discharge, no lesions              Cervix: no lesions                Bimanual Exam:  Uterus:  normal size, contour, position, consistency, mobility, non-tender              Adnexa: no mass, fullness, tenderness              Rectal exam: {yes no:314532}.  Confirms.              Anus:  normal sphincter tone, no lesions  Chaperone was present for exam.  ASSESSMENT     PLAN     An After Visit Summary was printed and given to the patient.  ______ minutes face to face time of which over 50% was spent in counseling.

## 2017-02-08 ENCOUNTER — Telehealth: Payer: Self-pay | Admitting: Obstetrics and Gynecology

## 2017-02-08 NOTE — Telephone Encounter (Signed)
Routing to [employer], CMA to assist with rescheduling.

## 2017-02-08 NOTE — Telephone Encounter (Signed)
Left patient a message to call and reschedule upcoming appointment 02/11/17 to discuss fibroids. To triage to assist with rescheduling if needs to be seen with a different provider.

## 2017-02-11 ENCOUNTER — Ambulatory Visit: Payer: BC Managed Care – PPO | Admitting: Obstetrics and Gynecology

## 2017-02-11 NOTE — Telephone Encounter (Signed)
Patient is returning a call to Amanda.

## 2017-02-12 ENCOUNTER — Encounter: Payer: Self-pay | Admitting: Obstetrics and Gynecology

## 2017-02-12 ENCOUNTER — Telehealth: Payer: Self-pay | Admitting: Obstetrics and Gynecology

## 2017-02-12 NOTE — Telephone Encounter (Signed)
Spoke with patient. Appointment rescheduled to 02/18/2017 at 10 am with Dr.Silva. Patient is agreeable to date and time. Encounter closed.

## 2017-02-12 NOTE — Telephone Encounter (Signed)
-----   Message from [place], Generic sent at 02/12/2017 10:58 AM EST -----    My appointment for 1/21 was cancelled (I was not told why??). I really need to talk with Dr. Quincy Simmonds about my "next steps" regarding my fibroid tumors. My insurance would not cover the cost of the DepoLupron shots...Marland Kitchenout of pocket was in access of $700. Dr. Quincy Simmonds does not recommend 'freezing my eggs" due to a low number on the [facility] lab (I believe that is right). She is recommending hysterectomy with the removal of the fibroid.  Id really like to know if there are other options? Specifically for the hysterectomy. Would it be worth rechecking the [facility] lab? Hysterectomy seems so final..... and have we considered other diagnosis such as adenomyosis?? Just asking....  Please let me know when my appointment can be rescheduled.  Mallory Jimenez   Called to patient and explained to her why appointment was cancelled. She was ok with explanation and will wait to hear back from nurse regarding an appointment.

## 2017-02-13 NOTE — Telephone Encounter (Signed)
Patient is scheduled with Dr.Silva 02-18-17 10:00am.

## 2017-02-14 NOTE — Progress Notes (Signed)
GYNECOLOGY  VISIT   HPI: 47 y.o.   Single  African American  female   Mallory Jimenez with Patient's last menstrual period was 02/13/2017 (within days).   here for 3 months follow up on fibroids.   Has a large known pedunculated fibroid 10 x 6 cm.  Had both ultrasound and MRI of pelvis. Has not taken Depo Lupron.   Menses are not heavy.  Less painful now.   Low AMH 0.23 on 08/24/15.  Feels like she is hotter than she has ever felt in the past.  Not sure if she is having hot flashes.  GYNECOLOGIC HISTORY: Patient's last menstrual period was 02/13/2017 (within days). Contraception:  OCOs Menopausal hormone therapy: Estrace vaginal cream Last mammogram: 12-27-15 BIRADS 1 negative/density c C/Neg/BiRads1:[facility] Last pap smear:   06/08/16 pap and HR HPV negative        OB History    Gravida Para Term Preterm AB Living   0 0 0 0 0 0   SAB TAB Ectopic Multiple Live Births   0 0 0 0 0         Patient Active Problem List   Diagnosis Date Noted  . Fibroid uterus 10/25/2016    Past Medical History:  Diagnosis Date  . Abnormal pap    ASCUS as a teenager   . Fibroid 01/05/2016   3 fibroids with one large pedunculated fibroid.  Ultrasound yearly.  . Irregular menses     Past Surgical History:  Procedure Laterality Date  . WISDOM TOOTH EXTRACTION Bilateral age 8    Current Outpatient Medications  Medication Sig Dispense Refill  . aspirin EC 81 MG tablet Take 81 mg by mouth 2 (two) times a week.    Marland Kitchen b complex vitamins tablet Take 1 tablet by mouth daily.    . Biotin 1000 MCG tablet Take 1,000 mcg by mouth as needed.     . Calcium Carb-Cholecalciferol (CALCIUM 600/VITAMIN D3) 600-800 MG-UNIT TABS Take 1 tablet by mouth daily.    Marland Kitchen estradiol (ESTRACE) 0.1 MG/GM vaginal cream Use 1 gm twice weekly (Patient taking differently: as needed. Use 1 gm twice weekly) 42.5 g 3  . fluticasone (FLONASE) 50 MCG/ACT nasal spray Place 2 sprays into the nose as needed.     Marland Kitchen ibuprofen  (ADVIL,MOTRIN) 100 MG chewable tablet Chew 100 mg by mouth every 8 (eight) hours as needed for fever.    Marland Kitchen levonorgestrel-ethinyl estradiol (LEVORA 0.15/30, 28,) 0.15-30 MG-MCG tablet Take 1 tablet by mouth daily. 84 tablet 4  . montelukast (SINGULAIR) 10 MG tablet Take 10 mg by mouth at bedtime.     . Prenatal Vit-Fe Fumarate-FA (MULTIVITAMIN-PRENATAL) 27-0.8 MG TABS Take 1 tablet by mouth daily at 12 noon.    . SUMAtriptan (IMITREX) 25 MG tablet Take 1 tablet by mouth as needed.  0  . vitamin C (ASCORBIC ACID) 500 MG tablet Take 500 mg by mouth as needed.     . Vitamin D, Ergocalciferol, (DRISDOL) 50000 UNITS CAPS capsule Take 1 capsule by mouth once a week.     No current facility-administered medications for this visit.      ALLERGIES: Patient has no known allergies.  Family History  Problem Relation Age of Onset  . Hypertension Mother   . Other Mother        benign abdominal tumor x 2  . Hypertension Father   . Hypertension Maternal Grandmother   . Hypertension Paternal Grandmother   . Lymphoma Cousin 30  Non hogkins lymphoma    Social History   Socioeconomic History  . Marital status: Single    Spouse name: Not on file  . Number of children: 0  . Years of education: Not on file  . Highest education level: Not on file  Social Needs  . Financial resource strain: Not on file  . Food insecurity - worry: Not on file  . Food insecurity - inability: Not on file  . Transportation needs - medical: Not on file  . Transportation needs - non-medical: Not on file  Occupational History    Employer: [employer]  Tobacco Use  . Smoking status: Never Smoker  . Smokeless tobacco: Never Used  Substance and Sexual Activity  . Alcohol use: No  . Drug use: No  . Sexual activity: No    Partners: Male    Birth control/protection: Pill    Comment: Nordette  Other Topics Concern  . 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    ROS:  Pertinent items are noted in HPI.  PHYSICAL EXAMINATION:    BP 118/72 (BP Location: Right Arm, Patient Position: Sitting, Cuff Size: Normal)   Pulse 80   Resp 16   Wt 163 lb (73.9 kg)   LMP 02/13/2017 (Within Days)   BMI 25.53 kg/m     General appearance: alert, cooperative and appears stated age   Abdomen: soft, non-tender.  Uterus extending to the right side almost to the umbilicus.  Pelvic: External genitalia:  no lesions              Urethra:  normal appearing urethra with no masses, tenderness or lesions              Bartholins and Skenes: normal                 Vagina: normal appearing vagina with normal color and discharge, no lesions              Cervix: no lesions                Bimanual Exam:  Uterus:  16 week size but asymmetric and to the right side.  Fills the pelvis.  Uterus feels retroverted.              Adnexa: no mass, fullness, tenderness                Chaperone was present for exam.  ASSESSMENT  Uterine fibroids. Large pedunculated fibroid. Hx painful menses - resolved.  Low AMH.  PLAN  We discussed options for care: 1 - Observation.  Reviewed signs and symptoms of enlarging fibroids. 2 - Laparoscopic myomectomy with Dr. Robbie Louis.  3 - Laparoscopy with possible laparoscopic myomectomy, abdominal myomectomy, hysterectomy (with bilateral salpingectomy).  Risks and benefits of myomectomy reviewed in detail.  CBC with PCP at upcoming appt. Follow up in May 2018 for annual exam and sooner prn.   An After Visit Summary was printed and given to the patient.  _25____ minutes face to face time of which over 50% was spent in counseling.

## 2017-02-18 ENCOUNTER — Ambulatory Visit: Payer: BC Managed Care – PPO | Admitting: Obstetrics and Gynecology

## 2017-02-18 ENCOUNTER — Encounter: Payer: Self-pay | Admitting: Obstetrics and Gynecology

## 2017-02-18 ENCOUNTER — Other Ambulatory Visit: Payer: Self-pay

## 2017-02-18 VITALS — BP 118/72 | HR 80 | Resp 16 | Wt 163.0 lb

## 2017-02-18 DIAGNOSIS — D219 Benign neoplasm of connective and other soft tissue, unspecified: Secondary | ICD-10-CM | POA: Diagnosis not present

## 2017-02-26 ENCOUNTER — Telehealth: Payer: Self-pay | Admitting: Obstetrics and Gynecology

## 2017-02-26 ENCOUNTER — Encounter: Payer: Self-pay | Admitting: Obstetrics and Gynecology

## 2017-02-26 DIAGNOSIS — D219 Benign neoplasm of connective and other soft tissue, unspecified: Secondary | ICD-10-CM

## 2017-02-26 NOTE — Telephone Encounter (Signed)
Patient sent the following correspondence through [place]. Routing to triage to assist patient with request.  ----- Message from [place], Generic sent at 02/26/2017 1:53 PM EST -----    Hello!  Dr Quincy Simmonds asked that I contact her if I wanted to see the gyn/surgeon specializing in laproscopic fibroid removal (I forget his name that she mentioned) I think I would like to proceed with a referral to that physician. Can you assist?    Also, I would like to ask Dr.Silva what is the anticipated amount of time I would be out of work for the fibroid removal surgery? Asking for this information as I am contemplating my next steps.     Thanks, [employer]

## 2017-02-26 NOTE — Telephone Encounter (Signed)
Dr. Quincy Simmonds -ok to proceed with referral to Dr. Kerin Perna for laparoscopic myomectomy?

## 2017-02-27 NOTE — Telephone Encounter (Signed)
I have signed the order for the referral.  Cc- Lerry Liner

## 2017-02-27 NOTE — Telephone Encounter (Signed)
Spoke with patient, advised as seen below per Dr. Quincy Simmonds. Patient request to proceed with referral to Dr. Kerin Perna for consult. Patient states she is still undecided, would like to proceed with consult before making her decision.   Advised patient will place referral, our referral coordinator will f/u with scheduling. Patient verbalizes understanding and is agreeable.   Order placed for referral to Dr. Kerin Perna.  Routing to referral coordinator, Lupton D.   Cc: Dr. Quincy Simmonds

## 2017-02-27 NOTE — Telephone Encounter (Signed)
Please proceed with referral to Dr. Kerin Perna.  I anticipate time out of work following laparoscopic myomectomy may 1 - 2 weeks but she would need to ask Dr. Kerin Perna.

## 2017-02-27 NOTE — Telephone Encounter (Signed)
Left message to call Sharee Pimple at [phone].   Order pended for referral to Dr. Kerin Perna

## 2017-03-05 NOTE — Telephone Encounter (Signed)
Per review of referral notes, patient scheduled with Dr. Kerin Perna on 04/12/17.   Routing to provider for final review. Will close encounter.  Cc: Mallory Jimenez

## 2017-06-10 ENCOUNTER — Ambulatory Visit: Payer: BC Managed Care – PPO | Admitting: Nurse Practitioner

## 2017-06-14 ENCOUNTER — Other Ambulatory Visit: Payer: Self-pay

## 2017-06-14 ENCOUNTER — Ambulatory Visit (INDEPENDENT_AMBULATORY_CARE_PROVIDER_SITE_OTHER): Payer: BC Managed Care – PPO | Admitting: Obstetrics and Gynecology

## 2017-06-14 ENCOUNTER — Encounter: Payer: Self-pay | Admitting: Obstetrics and Gynecology

## 2017-06-14 VITALS — BP 110/60 | HR 76 | Resp 16 | Ht 67.0 in | Wt 155.0 lb

## 2017-06-14 DIAGNOSIS — Z01419 Encounter for gynecological examination (general) (routine) without abnormal findings: Secondary | ICD-10-CM | POA: Diagnosis not present

## 2017-06-14 MED ORDER — LEVONORGESTREL-ETHINYL ESTRAD 0.15-30 MG-MCG PO TABS: 1.0000 | ORAL_TABLET | Freq: Every day | ORAL | 3 refills | Status: DC

## 2017-06-14 NOTE — Progress Notes (Signed)
47 y.o. G0P0000 Single African American female here for annual exam.    Scheduled for laparoscopic myomectomy for October 2019 with Dr. Kerin Perna.  Happy to be doing a uterine preserving procedure.   No increased pain or bleeding.  States she is having scant bleeding for 1 - 2 days on her placebo pills. Feeling increased heat.   Labs with PCP.   PCP:  Dr. Nicholos Johns   Patient's last menstrual period was 05/13/2017 (within weeks).           Sexually active: No.  The current method of family planning is OCP (estrogen/progesterone).    Exercising: Yes.    cardio and strength Smoker:  no  Health Maintenance: Pap:  06/08/16 pap and HR HPV negative History of abnormal Pap:  Yes, in her teens.  MMG:  2019 at [place]:  01/30/07.  Will do with PCP. Gardasil:   no HIV: 06/06/15 Negative Hep C: never Screening Labs:  PCP   reports that she has never smoked. She has never used smokeless tobacco. She reports that she does not drink alcohol or use drugs.  Past Medical History:  Diagnosis Date  . Abnormal pap    ASCUS as a teenager   . Fibroid 01/05/2016   3 fibroids with one large pedunculated fibroid.  Ultrasound yearly.  . Irregular menses     Past Surgical History:  Procedure Laterality Date  . WISDOM TOOTH EXTRACTION Bilateral age 70    Current Outpatient Medications  Medication Sig Dispense Refill  . aspirin EC 81 MG tablet Take 81 mg by mouth 2 (two) times a week.    . Biotin 1000 MCG tablet Take 1,000 mcg by mouth as needed.     . Calcium Carb-Cholecalciferol (CALCIUM 600/VITAMIN D3) 600-800 MG-UNIT TABS Take 1 tablet by mouth daily.    Marland Kitchen estradiol (ESTRACE) 0.1 MG/GM vaginal cream Use 1 gm twice weekly (Patient taking differently: as needed. Use 1 gm twice weekly) 42.5 g 3  . fluticasone (FLONASE) 50 MCG/ACT nasal spray Place 2 sprays into the nose as needed.     Marland Kitchen ibuprofen (ADVIL,MOTRIN) 100 MG chewable tablet Chew 100 mg by mouth every 8 (eight) hours as  needed for fever.    Marland Kitchen levonorgestrel-ethinyl estradiol (LEVORA 0.15/30, 28,) 0.15-30 MG-MCG tablet Take 1 tablet by mouth daily. 84 tablet 3  . montelukast (SINGULAIR) 10 MG tablet Take 10 mg by mouth at bedtime.     . Prenatal Vit-Fe Fumarate-FA (MULTIVITAMIN-PRENATAL) 27-0.8 MG TABS Take 1 tablet by mouth daily at 12 noon.    . SUMAtriptan (IMITREX) 25 MG tablet Take 1 tablet by mouth as needed.  0  . vitamin C (ASCORBIC ACID) 500 MG tablet Take 500 mg by mouth as needed.     . Vitamin D, Ergocalciferol, (DRISDOL) 50000 UNITS CAPS capsule Take 1 capsule by mouth once a week.     No current facility-administered medications for this visit.     Family History  Problem Relation Age of Onset  . Hypertension Mother   . Other Mother        benign abdominal tumor x 2  . Hypertension Father   . Hypertension Maternal Grandmother   . Hypertension Paternal Grandmother   . Lymphoma Cousin 30       Non hogkins lymphoma    Review of Systems  Constitutional: Negative.   HENT: Negative.   Eyes: Negative.   Respiratory: Negative.   Cardiovascular: Negative.   Gastrointestinal: Negative.   Endocrine: Negative.  Genitourinary: Negative.   Musculoskeletal: Negative.   Skin: Negative.   Allergic/Immunologic: Negative.   Neurological: Negative.   Hematological: Negative.   Psychiatric/Behavioral: Negative.     Exam:   BP 110/60 (BP Location: Right Arm, Patient Position: Sitting, Cuff Size: Normal)   Pulse 76   Resp 16   Ht 5\' 7"  (1.702 m)   Wt 155 lb (70.3 kg)   LMP 05/13/2017 (Within Weeks)   BMI 24.28 kg/m     General appearance: alert, cooperative and appears stated age Head: Normocephalic, without obvious abnormality, atraumatic Neck: no adenopathy, supple, symmetrical, trachea midline and thyroid normal to inspection and palpation Lungs: clear to auscultation bilaterally Breasts: normal appearance, no masses or tenderness, No nipple retraction or dimpling, No nipple discharge  or bleeding, No axillary or supraclavicular adenopathy Heart: regular rate and rhythm Abdomen: soft, non-tender; no masses, no organomegaly Extremities: extremities normal, atraumatic, no cyanosis or edema Skin: Skin color, texture, turgor normal. No rashes or lesions Lymph nodes: Cervical, supraclavicular, and axillary nodes normal. No abnormal inguinal nodes palpated Neurologic: Grossly normal  Pelvic: External genitalia:  no lesions              Urethra:  normal appearing urethra with no masses, tenderness or lesions              Bartholins and Skenes: normal                 Vagina: normal appearing vagina with normal color and discharge, no lesions              Cervix: no lesions              Pap taken: No. Bimanual Exam:  Uterus:  16 - 17 week size and extending to the right. LUS fibroids.  Uterus fills pelvis.              Adnexa: no mass, fullness, tenderness              Rectal exam: Yes.  .  Confirms.              Anus:  normal sphincter tone, no lesions  Chaperone was present for exam.  Assessment:   Well woman visit with normal exam. Fibroids uterus.   Plan: Mammogram screening yearly.  Will get a copy of recent mammogram.  Recommended self breast awareness. Pap and HR HPV as above. Guidelines for Calcium, Vitamin D, regular exercise program including cardiovascular and weight bearing exercise. TDap and labs with PCP.  Laparoscopic myomectomy in the Fall. Follow up annually and prn.   After visit summary provided.

## 2017-06-14 NOTE — Patient Instructions (Signed)

## 2017-08-20 ENCOUNTER — Other Ambulatory Visit: Payer: Self-pay

## 2017-08-20 DIAGNOSIS — N952 Postmenopausal atrophic vaginitis: Secondary | ICD-10-CM

## 2017-08-20 MED ORDER — ESTRADIOL 0.1 MG/GM VA CREA: TOPICAL_CREAM | VAGINAL | 0 refills | Status: DC

## 2017-08-20 NOTE — Telephone Encounter (Signed)
Medication refill request: Kurvelo tabs 28s  Last AEX:  06/14/17 with BS Next AEX: 06/27/18  Last MMG (if hormonal medication request): 03/12/17 Bi-rads category 1 neg  Refill authorized: Please refill if appropriate.

## 2017-09-18 ENCOUNTER — Other Ambulatory Visit: Payer: Self-pay | Admitting: Obstetrics and Gynecology

## 2017-09-18 DIAGNOSIS — N952 Postmenopausal atrophic vaginitis: Secondary | ICD-10-CM

## 2017-09-18 NOTE — Telephone Encounter (Signed)
Spoke with patient. Patient states she only uses estrace cream prn and does not need a refill at this time. Advised to call when refill needed. Patient agreeable.   Encounter closed.

## 2017-10-14 NOTE — Patient Instructions (Signed)
Your procedure is scheduled on 10-29-17  Report to [facility]   Call this number if you have problems the morning of surgery  [phone].   OUR ADDRESS IS [address].  WE ARE LOCATED IN THE [place]                                   [facility].                                     REMEMBER:  DO NOT EAT FOOD OR DRINK LIQUIDS AFTER MIDNIGHT .  TAKE THESE MEDICATIONS MORNING OF SURGERY WITH A SIP OF WATER:  LEVORA                                    DO NOT WEAR JEWERLY, MAKE UP, OR NAIL POLISH,  DO NOT WEAR LOTIONS, POWDERS, PERFUMES OR DEODORANT. DO NOT SHAVE FOR 24 HOURS PRIOR TO DAY OF SURGERY. MEN MAY SHAVE FACE AND NECK. CONTACTS, GLASSES, OR DENTURES MAY NOT BE WORN TO SURGERY.                                    [facility] IS NOT RESPONSIBLE  FOR ANY BELONGINGS.                                          DRIVER                           .             [facility] - Preparing for Surgery Before surgery, you can play an important role.  Because skin is not sterile, your skin needs to be as free of germs as possible.  You can reduce the number of germs on your skin by washing with CHG (chlorahexidine gluconate) soap before surgery.  CHG is an antiseptic cleaner which kills germs and bonds with the skin to continue killing germs even after washing. Please DO NOT use if you have an allergy to CHG or antibacterial soaps.  If your skin becomes reddened/irritated stop using the CHG and inform your nurse when you arrive at Short Stay. Do not shave (including legs and underarms) for at least 48 hours prior to the first CHG shower.  You may shave your face/neck. Please follow these instructions carefully:  1.  Shower with CHG Soap the night before surgery and the  morning of Surgery.  2.  If you choose to wash your hair, wash your hair first as usual with your  normal  shampoo.  3.  After you shampoo, rinse your hair and body thoroughly to remove the   shampoo.                           4.  Use CHG as you would any other liquid soap.  You can apply chg directly  to the skin and wash  Gently with a scrungie or clean washcloth.  5.  Apply the CHG Soap to your body ONLY FROM THE NECK DOWN.   Do not use on face/ open                           Wound or open sores. Avoid contact with eyes, ears mouth and genitals (private parts).                       Wash face,  Genitals (private parts) with your normal soap.             6.  Wash thoroughly, paying special attention to the area where your surgery  will be performed.  7.  Thoroughly rinse your body with warm water from the neck down.  8.  DO NOT shower/wash with your normal soap after using and rinsing off  the CHG Soap.                9.  Pat yourself dry with a clean towel.            10.  Wear clean pajamas.            11.  Place clean sheets on your bed the night of your first shower and do not  sleep with pets. Day of Surgery : Do not apply any lotions/deodorants the morning of surgery.  Please wear clean clothes to the hospital/surgery center.  FAILURE TO FOLLOW THESE INSTRUCTIONS MAY RESULT IN THE CANCELLATION OF YOUR SURGERY PATIENT SIGNATURE_________________________________  NURSE SIGNATURE__________________________________  ________________________________________________________________________

## 2017-10-18 ENCOUNTER — Other Ambulatory Visit: Payer: Self-pay

## 2017-10-18 ENCOUNTER — Encounter (HOSPITAL_COMMUNITY)
Admission: RE | Admit: 2017-10-18 | Discharge: 2017-10-18 | Disposition: A | Discharge status: Home / Self Care | Payer: BC Managed Care – PPO | Source: Ambulatory Visit | Attending: Obstetrics and Gynecology | Admitting: Obstetrics and Gynecology

## 2017-10-18 ENCOUNTER — Encounter (HOSPITAL_COMMUNITY): Payer: Self-pay

## 2017-10-18 DIAGNOSIS — D219 Benign neoplasm of connective and other soft tissue, unspecified: Secondary | ICD-10-CM | POA: Diagnosis present

## 2017-10-18 HISTORY — DX: Benign neoplasm of connective and other soft tissue, unspecified: D21.9

## 2017-10-18 HISTORY — DX: Low back pain, unspecified: M54.50

## 2017-10-18 HISTORY — DX: Low back pain: M54.5

## 2017-10-18 LAB — CBC
HEMATOCRIT: 40.4 % (ref 36.0–46.0)
HEMOGLOBIN: 13.3 g/dL (ref 12.0–15.0)
MCH: 30.6 pg (ref 26.0–34.0)
MCHC: 32.9 g/dL (ref 30.0–36.0)
MCV: 93.1 fL (ref 78.0–100.0)
Platelets: 251 10*3/uL (ref 150–400)
RBC: 4.34 MIL/uL (ref 3.87–5.11)
RDW: 13.7 % (ref 11.5–15.5)
WBC: 6 10*3/uL (ref 4.0–10.5)

## 2017-10-18 LAB — TYPE AND SCREEN
ABO/RH(D): O POS
Antibody Screen: NEGATIVE

## 2017-10-19 LAB — ABO/RH: ABO/RH(D): O POS

## 2017-10-28 NOTE — Anesthesia Preprocedure Evaluation (Addendum)
Anesthesia Evaluation  Patient identified by MRN, date of birth, ID band Patient awake    Reviewed: Allergy & Precautions, NPO status , Patient's Chart, lab work & pertinent test results  History of Anesthesia Complications Negative for: history of anesthetic complications  Airway Mallampati: I  TM Distance: >3 FB Neck ROM: Full    Dental no notable dental hx. (+) Teeth Intact, Dental Advisory Given   Pulmonary neg pulmonary ROS,    Pulmonary exam normal breath sounds clear to auscultation       Cardiovascular negative cardio ROS Normal cardiovascular exam Rhythm:Regular Rate:Normal     Neuro/Psych  Headaches, negative psych ROS   GI/Hepatic negative GI ROS, Neg liver ROS,   Endo/Other  negative endocrine ROS  Renal/GU negative Renal ROS  negative genitourinary   Musculoskeletal negative musculoskeletal ROS (+)   Abdominal   Peds negative pediatric ROS (+)  Hematology negative hematology ROS (+)   Anesthesia Other Findings   Reproductive/Obstetrics negative OB ROS Uterine fibroids                            Anesthesia Physical Anesthesia Plan  ASA: II  Anesthesia Plan: General   Post-op Pain Management:    Induction: Intravenous  PONV Risk Score and Plan: 3 and Ondansetron, Dexamethasone, Treatment may vary due to age or medical condition and Scopolamine patch - Pre-op  Airway Management Planned: Oral ETT  Additional Equipment:   Intra-op Plan:   Post-operative Plan: Extubation in OR  Informed Consent: I have reviewed the patients History and Physical, chart, labs and discussed the procedure including the risks, benefits and alternatives for the proposed anesthesia with the patient or authorized representative who has indicated his/her understanding and acceptance.   Dental advisory given  Plan Discussed with:   Anesthesia Plan Comments:        Anesthesia Quick  Evaluation

## 2017-10-29 ENCOUNTER — Ambulatory Visit (HOSPITAL_BASED_OUTPATIENT_CLINIC_OR_DEPARTMENT_OTHER): Payer: BC Managed Care – PPO | Admitting: Anesthesiology

## 2017-10-29 ENCOUNTER — Ambulatory Visit (HOSPITAL_BASED_OUTPATIENT_CLINIC_OR_DEPARTMENT_OTHER)
Admission: RE | Admit: 2017-10-29 | Discharge: 2017-10-29 | Disposition: A | Discharge status: Home / Self Care | Payer: BC Managed Care – PPO | Source: Other Acute Inpatient Hospital | Attending: Obstetrics and Gynecology | Admitting: Obstetrics and Gynecology

## 2017-10-29 ENCOUNTER — Encounter (HOSPITAL_BASED_OUTPATIENT_CLINIC_OR_DEPARTMENT_OTHER): Payer: Self-pay

## 2017-10-29 ENCOUNTER — Encounter (HOSPITAL_BASED_OUTPATIENT_CLINIC_OR_DEPARTMENT_OTHER)
Admission: RE | Disposition: A | Discharge status: Home / Self Care | Payer: Self-pay | Source: Other Acute Inpatient Hospital | Attending: Obstetrics and Gynecology

## 2017-10-29 DIAGNOSIS — N803 Endometriosis of pelvic peritoneum: Secondary | ICD-10-CM | POA: Insufficient documentation

## 2017-10-29 DIAGNOSIS — D252 Subserosal leiomyoma of uterus: Secondary | ICD-10-CM | POA: Insufficient documentation

## 2017-10-29 DIAGNOSIS — K660 Peritoneal adhesions (postprocedural) (postinfection): Secondary | ICD-10-CM | POA: Diagnosis not present

## 2017-10-29 DIAGNOSIS — D251 Intramural leiomyoma of uterus: Secondary | ICD-10-CM | POA: Diagnosis present

## 2017-10-29 DIAGNOSIS — N920 Excessive and frequent menstruation with regular cycle: Secondary | ICD-10-CM | POA: Diagnosis not present

## 2017-10-29 HISTORY — PX: LAPAROSCOPIC GELPORT ASSISTED MYOMECTOMY: SHX6549

## 2017-10-29 SURGERY — LAPAROSCOPIC GELPORT ASSISTED MYOMECTOMY
Anesthesia: General | Site: Abdomen

## 2017-10-29 MED ORDER — LACTATED RINGERS IV SOLN
INTRAVENOUS | Status: DC
Start: 2017-10-29 — End: 2017-10-29
  Administered 2017-10-29 (×4): via INTRAVENOUS
  Filled 2017-10-29: qty 1000

## 2017-10-29 MED ORDER — ONDANSETRON HCL 4 MG PO TABS: 4.0000 mg | ORAL_TABLET | Freq: Three times a day (TID) | ORAL | 1 refills | Status: DC | PRN

## 2017-10-29 MED ORDER — ONDANSETRON HCL 4 MG/2ML IJ SOLN
INTRAMUSCULAR | Status: AC
  Filled 2017-10-29: qty 2

## 2017-10-29 MED ORDER — FENTANYL CITRATE (PF) 100 MCG/2ML IJ SOLN
INTRAMUSCULAR | Status: DC | PRN
  Administered 2017-10-29 (×3): 100 ug via INTRAVENOUS
  Administered 2017-10-29: 50 ug via INTRAVENOUS

## 2017-10-29 MED ORDER — PROPOFOL 10 MG/ML IV BOLUS
INTRAVENOUS | Status: AC
  Filled 2017-10-29: qty 20

## 2017-10-29 MED ORDER — HEMOSTATIC AGENTS (NO CHARGE) OPTIME
TOPICAL | Status: DC | PRN
  Administered 2017-10-29: 1 via TOPICAL

## 2017-10-29 MED ORDER — SUGAMMADEX SODIUM 200 MG/2ML IV SOLN
INTRAVENOUS | Status: AC
  Filled 2017-10-29: qty 2

## 2017-10-29 MED ORDER — OXYCODONE HCL 5 MG/5ML PO SOLN
5.0000 mg | Freq: Once | ORAL | Status: AC | PRN
  Filled 2017-10-29: qty 5

## 2017-10-29 MED ORDER — METHYLENE BLUE 0.5 % INJ SOLN
INTRAVENOUS | Status: DC | PRN
  Administered 2017-10-29: 2 mL via SUBMUCOSAL

## 2017-10-29 MED ORDER — FENTANYL CITRATE (PF) 250 MCG/5ML IJ SOLN
INTRAMUSCULAR | Status: AC
  Filled 2017-10-29: qty 5

## 2017-10-29 MED ORDER — CEFAZOLIN SODIUM-DEXTROSE 2-4 GM/100ML-% IV SOLN
INTRAVENOUS | Status: AC
  Filled 2017-10-29: qty 100

## 2017-10-29 MED ORDER — OXYCODONE HCL 5 MG PO TABS
ORAL_TABLET | ORAL | Status: AC
  Filled 2017-10-29: qty 1

## 2017-10-29 MED ORDER — MIDAZOLAM HCL 2 MG/2ML IJ SOLN
INTRAMUSCULAR | Status: AC
  Filled 2017-10-29: qty 2

## 2017-10-29 MED ORDER — LIDOCAINE HCL (CARDIAC) PF 100 MG/5ML IV SOSY
PREFILLED_SYRINGE | INTRAVENOUS | Status: AC
  Filled 2017-10-29: qty 5

## 2017-10-29 MED ORDER — ROCURONIUM BROMIDE 100 MG/10ML IV SOLN
INTRAVENOUS | Status: DC | PRN
  Administered 2017-10-29 (×2): 20 mg via INTRAVENOUS
  Administered 2017-10-29: 50 mg via INTRAVENOUS

## 2017-10-29 MED ORDER — FENTANYL CITRATE (PF) 100 MCG/2ML IJ SOLN
25.0000 ug | INTRAMUSCULAR | Status: DC | PRN
  Administered 2017-10-29 (×3): 25 ug via INTRAVENOUS
  Filled 2017-10-29: qty 1

## 2017-10-29 MED ORDER — MEPERIDINE HCL 25 MG/ML IJ SOLN
6.2500 mg | INTRAMUSCULAR | Status: DC | PRN
  Filled 2017-10-29: qty 1

## 2017-10-29 MED ORDER — SUGAMMADEX SODIUM 200 MG/2ML IV SOLN
INTRAVENOUS | Status: DC | PRN
  Administered 2017-10-29: 200 mg via INTRAVENOUS

## 2017-10-29 MED ORDER — PHENYLEPHRINE HCL 10 MG/ML IJ SOLN
INTRAMUSCULAR | Status: DC | PRN
  Administered 2017-10-29 (×8): 100 ug via INTRAVENOUS

## 2017-10-29 MED ORDER — FENTANYL CITRATE (PF) 100 MCG/2ML IJ SOLN
INTRAMUSCULAR | Status: AC
  Filled 2017-10-29: qty 2

## 2017-10-29 MED ORDER — OXYCODONE-ACETAMINOPHEN 7.5-325 MG PO TABS: 1.0000 | ORAL_TABLET | ORAL | 0 refills | Status: DC | PRN

## 2017-10-29 MED ORDER — DEXAMETHASONE SODIUM PHOSPHATE 10 MG/ML IJ SOLN
INTRAMUSCULAR | Status: AC
  Filled 2017-10-29: qty 1

## 2017-10-29 MED ORDER — VASOPRESSIN 20 UNIT/ML IV SOLN
INTRAVENOUS | Status: DC | PRN
  Administered 2017-10-29: 50 mL via INTRAMUSCULAR

## 2017-10-29 MED ORDER — BUPIVACAINE-EPINEPHRINE 0.5% -1:200000 IJ SOLN
INTRAMUSCULAR | Status: DC | PRN
  Administered 2017-10-29: 10 mL

## 2017-10-29 MED ORDER — LIDOCAINE HCL (CARDIAC) PF 100 MG/5ML IV SOSY
PREFILLED_SYRINGE | INTRAVENOUS | Status: DC | PRN
  Administered 2017-10-29 (×2): 50 mg via INTRAVENOUS

## 2017-10-29 MED ORDER — PROMETHAZINE HCL 25 MG/ML IJ SOLN
6.2500 mg | INTRAMUSCULAR | Status: DC | PRN
  Filled 2017-10-29: qty 1

## 2017-10-29 MED ORDER — PHENYLEPHRINE 40 MCG/ML (10ML) SYRINGE FOR IV PUSH (FOR BLOOD PRESSURE SUPPORT)
PREFILLED_SYRINGE | INTRAVENOUS | Status: AC
  Filled 2017-10-29: qty 10

## 2017-10-29 MED ORDER — KETOROLAC TROMETHAMINE 30 MG/ML IJ SOLN
INTRAMUSCULAR | Status: DC | PRN
  Administered 2017-10-29: 30 mg via INTRAVENOUS

## 2017-10-29 MED ORDER — ACETAMINOPHEN 10 MG/ML IV SOLN
1000.0000 mg | Freq: Once | INTRAVENOUS | Status: DC | PRN
  Filled 2017-10-29: qty 100

## 2017-10-29 MED ORDER — ONDANSETRON HCL 4 MG/2ML IJ SOLN
INTRAMUSCULAR | Status: DC | PRN
  Administered 2017-10-29: 4 mg via INTRAVENOUS

## 2017-10-29 MED ORDER — CEFAZOLIN SODIUM-DEXTROSE 2-4 GM/100ML-% IV SOLN
2.0000 g | INTRAVENOUS | Status: AC
  Administered 2017-10-29: 2 g via INTRAVENOUS
  Filled 2017-10-29: qty 100

## 2017-10-29 MED ORDER — KETOROLAC TROMETHAMINE 30 MG/ML IJ SOLN
INTRAMUSCULAR | Status: AC
  Filled 2017-10-29: qty 1

## 2017-10-29 MED ORDER — DEXAMETHASONE SODIUM PHOSPHATE 10 MG/ML IJ SOLN
INTRAMUSCULAR | Status: DC | PRN
  Administered 2017-10-29: 5 mg via INTRAVENOUS

## 2017-10-29 MED ORDER — OXYCODONE HCL 5 MG PO TABS
5.0000 mg | ORAL_TABLET | Freq: Once | ORAL | Status: AC | PRN
  Administered 2017-10-29: 5 mg via ORAL
  Filled 2017-10-29: qty 1

## 2017-10-29 MED ORDER — MIDAZOLAM HCL 5 MG/5ML IJ SOLN
INTRAMUSCULAR | Status: DC | PRN
  Administered 2017-10-29: 2 mg via INTRAVENOUS

## 2017-10-29 MED ORDER — PROPOFOL 10 MG/ML IV BOLUS
INTRAVENOUS | Status: DC | PRN
  Administered 2017-10-29: 200 mg via INTRAVENOUS

## 2017-10-29 SURGICAL SUPPLY — 85 items
ADH SKN CLS APL DERMABOND .7 (GAUZE/BANDAGES/DRESSINGS) ×1
BAG RETRIEVAL 10 (BASKET)
BAG RETRIEVAL 10MM (BASKET)
BLADE EXTENDED COATED 6.5IN (ELECTRODE) IMPLANT
BRR ADH 6X5 SEPRAFILM 1 SHT (MISCELLANEOUS) ×2
CABLE HIGH FREQUENCY MONO STRZ (ELECTRODE) ×3 IMPLANT
CLEANER CAUTERY TIP 5X5 PAD (MISCELLANEOUS) ×1 IMPLANT
CLOSURE WOUND 1/2 X4 (GAUZE/BANDAGES/DRESSINGS) ×1
COVER MAYO STAND STRL (DRAPES) ×3 IMPLANT
COVER WAND RF STERILE (DRAPES) ×2 IMPLANT
DERMABOND ADVANCED (GAUZE/BANDAGES/DRESSINGS) ×2
DERMABOND ADVANCED .7 DNX12 (GAUZE/BANDAGES/DRESSINGS) ×1 IMPLANT
DRSG OPSITE POSTOP 3X4 (GAUZE/BANDAGES/DRESSINGS) IMPLANT
DRSG TEGADERM 4X4.75 (GAUZE/BANDAGES/DRESSINGS) IMPLANT
DRSG TELFA 3X8 NADH (GAUZE/BANDAGES/DRESSINGS) ×3 IMPLANT
DURAPREP 26ML APPLICATOR (WOUND CARE) ×3 IMPLANT
ELECT NDL TIP 2.8 STRL (NEEDLE) IMPLANT
ELECT NEEDLE TIP 2.8 STRL (NEEDLE) IMPLANT
ELECT REM PT RETURN 9FT ADLT (ELECTROSURGICAL) ×3
ELECTRODE REM PT RTRN 9FT ADLT (ELECTROSURGICAL) ×1 IMPLANT
GAUZE 4X4 16PLY RFD (DISPOSABLE) ×3 IMPLANT
GLOVE BIO SURGEON STRL SZ 6.5 (GLOVE) ×2 IMPLANT
GLOVE BIO SURGEON STRL SZ8 (GLOVE) ×6 IMPLANT
GLOVE BIO SURGEONS STRL SZ 6.5 (GLOVE) ×2
GLOVE BIOGEL PI IND STRL 6.5 (GLOVE) IMPLANT
GLOVE BIOGEL PI IND STRL 7.5 (GLOVE) IMPLANT
GLOVE BIOGEL PI IND STRL 8.5 (GLOVE) IMPLANT
GLOVE BIOGEL PI INDICATOR 6.5 (GLOVE) ×4
GLOVE BIOGEL PI INDICATOR 7.5 (GLOVE) ×4
GLOVE BIOGEL PI INDICATOR 8.5 (GLOVE) ×2
GLOVE INDICATOR 8.5 STRL (GLOVE) ×2 IMPLANT
GOWN STRL REUS W/TWL LRG LVL3 (GOWN DISPOSABLE) ×6 IMPLANT
GOWN STRL REUS W/TWL XL LVL3 (GOWN DISPOSABLE) ×6 IMPLANT
HOLDER FOLEY CATH W/STRAP (MISCELLANEOUS) IMPLANT
IV NS IRRIG 3000ML ARTHROMATIC (IV SOLUTION) ×2 IMPLANT
KIT TURNOVER CYSTO (KITS) ×3 IMPLANT
MANIPULATOR UTERINE 4.5 ZUMI (MISCELLANEOUS) ×3 IMPLANT
NDL SAFETY ECLIPSE 18X1.5 (NEEDLE) IMPLANT
NEEDLE HYPO 18GX1.5 SHARP (NEEDLE)
NEEDLE HYPO 22GX1.5 SAFETY (NEEDLE) ×6 IMPLANT
NEEDLE INSUFFLATION 120MM (ENDOMECHANICALS) ×3 IMPLANT
NS IRRIG 500ML POUR BTL (IV SOLUTION) ×3 IMPLANT
PACK LAPAROSCOPY BASIN (CUSTOM PROCEDURE TRAY) ×3 IMPLANT
PACK TRENDGUARD 450 HYBRID PRO (MISCELLANEOUS) IMPLANT
PAD CLEANER CAUTERY TIP 5X5 (MISCELLANEOUS) ×2
PAD DRESSING TELFA 3X8 NADH (GAUZE/BANDAGES/DRESSINGS) ×1 IMPLANT
PAD OB MATERNITY 4.3X12.25 (PERSONAL CARE ITEMS) ×3 IMPLANT
PENCIL BUTTON HOLSTER BLD 10FT (ELECTRODE) ×3 IMPLANT
POUCH LAPAROSCOPIC INSTRUMENT (MISCELLANEOUS) ×2 IMPLANT
SEPRAFILM MEMBRANE 5X6 (MISCELLANEOUS) ×4 IMPLANT
SET IRRIG TUBING LAPAROSCOPIC (IRRIGATION / IRRIGATOR) ×3 IMPLANT
SET TRI-LUMEN FLTR TB AIRSEAL (TUBING) IMPLANT
SHEARS HARMONIC ACE PLUS 36CM (ENDOMECHANICALS) ×2 IMPLANT
SPONGE LAP 18X18 RF (DISPOSABLE) IMPLANT
STOPCOCK 4 WAY LG BORE MALE ST (IV SETS) IMPLANT
STRIP CLOSURE SKIN 1/2X4 (GAUZE/BANDAGES/DRESSINGS) ×1 IMPLANT
SUT MNCRL AB 4-0 PS2 18 (SUTURE) ×5 IMPLANT
SUT VIC AB 0 CT1 36 (SUTURE) IMPLANT
SUT VIC AB 2-0 CT1 (SUTURE) ×8 IMPLANT
SUT VIC AB 2-0 CT2 27 (SUTURE) ×2 IMPLANT
SUT VIC AB 2-0 UR6 27 (SUTURE) IMPLANT
SUT VIC AB 4-0 SH 27 (SUTURE) ×6
SUT VIC AB 4-0 SH 27XANBCTRL (SUTURE) ×2 IMPLANT
SYR 10ML LL (SYRINGE) ×4 IMPLANT
SYR 30ML LL (SYRINGE) ×3 IMPLANT
SYR 50ML LL SCALE MARK (SYRINGE) ×2 IMPLANT
SYR 5ML LL (SYRINGE) ×3 IMPLANT
SYR CONTROL 10ML LL (SYRINGE) ×6 IMPLANT
SYS BAG RETRIEVAL 10MM (BASKET)
SYS LAPSCP GELPORT 120MM (MISCELLANEOUS)
SYSTEM BAG RETRIEVAL 10MM (BASKET) IMPLANT
SYSTEM LAPSCP GELPORT 120MM (MISCELLANEOUS) IMPLANT
TOWEL OR 17X24 6PK STRL BLUE (TOWEL DISPOSABLE) ×6 IMPLANT
TRAY FOLEY W/BAG SLVR 14FR (SET/KITS/TRAYS/PACK) ×3 IMPLANT
TRENDGUARD 450 HYBRID PRO PACK (MISCELLANEOUS)
TROCAR OPTI TIP 5M 100M (ENDOMECHANICALS) ×10 IMPLANT
TROCAR PORT AIRSEAL 5X120 (TROCAR) IMPLANT
TROCAR XCEL DIL TIP R 11M (ENDOMECHANICALS) IMPLANT
TUBE CONNECTING 12'X1/4 (SUCTIONS) ×1
TUBE CONNECTING 12X1/4 (SUCTIONS) ×1 IMPLANT
TUBING INSUF HEATED (TUBING) IMPLANT
TUBING SUCTION BULK 100 FT (MISCELLANEOUS) IMPLANT
WARMER LAPAROSCOPE (MISCELLANEOUS) ×3 IMPLANT
WATER STERILE IRR 500ML POUR (IV SOLUTION) ×3 IMPLANT
YANKAUER SUCT BULB TIP NO VENT (SUCTIONS) ×2 IMPLANT

## 2017-10-29 NOTE — Op Note (Signed)
Operative Note  Preoperative diagnosis: Uterine fibroids, menorrhagia  Postoperative diagnosis: Uterine fibroids, subserosal and intramural (790 gm), stage I endometriosis of pelvic peritoneum, menorrhagia  Procedure: Laparoscopy, GelPort assisted myomectomy, lysis of adhesions, excision of endometriosis, chromotubation  Anesthesia: Gen. endotracheal  Complications: Bleeding from the specimen side of the pedunculated myoma  Estimated blood loss: [phone] ml  Specimens: Uterine myomas and left anterior cul-de-sac peritoneal biopsy to pathology  Findings: On examination under anesthesia, there was a pelvic mass rising to 20 weeks size and occupying the right lower quadrant which was ballotable.  External genitalia, Bartholin's, Skene's, and urethra were normal. The vagina was normal. The cervix was displaced to the left and was nulliparous and appeared grossly normal. The uterus was 20 week size, firm and mobile with irregularities caused by myomas. It sounded to 10 cm.  On laparoscopy, upper abdomen, liver surface and diaphragm surfaces were normal. Gallbladder was normal. The appendix was retrocecal and appeared normal.  The uterus contained a pedunculated 10 cm myoma growing from the right cornual region of the uterus and encroaching onto the uterotubal junction of the right tube such that we had to cut through the right tube to remove the fibroid.  There was a 2.5 x 2.5 cm posterior midline intramural myoma which was removed.  There were several sub-1 cm myomas anteriorly, which were not removed.   The left tube and ovary appeared normal.   The right ovary appeared normal.   The right tube appeared normal except for the fact that the pedunculated large myoma was growing through the right uterotubal junction. The pelvic peritoneum looked mostly normal except in the anterior cul-de-sac where there was a cluster of brown pigmented, stellate fibrotic and clear lesions spreading over a 2 x  2 centimeter area in the left side of the cul-de-sac.  This area was removed in toto.  Description of the procedure: The patient was placed in dorsal supine position and general endotracheal anesthesia was given. 2 g of cefazolin were given intravenously for prophylaxis. Patient was placed in lithotomy position. She was prepped and draped in sterile manner.    A Foley catheter was inserted into the bladder.. A ZUMI catheter was placed into the uterine cavity. This was connected to a syringe containing diluted methylene blue solution which was used to define the endometrium during the myomectomy. The uterus sounded to 10 cm The surgeon was regloved and a surgical field was created on the abdomen.  After preemptive anesthesia of all surgical sites with 0.5% bupivacaine, a 5 mm supraumbilical skin incision was made 4 cm above the umbilicus and a Verress needle was inserted. Its correct location was confirmed. A pneumoperitoneum was created with carbon dioxide.  5 mm laparoscope with a 30 lens was inserted and video laparoscopy was started . A left lower quadrant 5 mm and a right lower quadrant  5 mm incisions were made and ancillary trochars were placed under direct visualization. Above findings were noted.  A dilute solution of vasopressin (0.4 units per mL) was injected into the myometrium overlying the fundal myoma, until the myometrium blanched.  Harmonic Ace was applied to the pedicle of the myoma which had to be incorporate the stretched portion of the proximal right fallopian tube and it was coagulated and cut.  We then made a 4 cm transverse suprapubic incision to insert a GelPort. After dissection of the anatomic layers, the peritoneal cavity was entered. A GelPort was placed and the rest of the case was performed either  using this port as a laparoscopic port or as a minilaparotomy port.  Since the myomectomy site was bleeding we first closed that with 3 layers of 2-0 Vicryl continuous suture.  The  first layer was interlocking and the second and third layers were continuous suture of 2-0 Vicryl.  Chromotubation was also performed.  The tubes did not fill with methylene blue but we thought this was a technical artifact since the proximal and distal segments of both tubes appeared normal. The rest of the myoma had to be in situ morcellated to eventually take it out of this 4 cm incision. This was carried out by blunt and sharp dissection and in situ morcellation was performed with #10 blade.  At this point we noticed that the pedicle on the specimen side had been bleeding, indicating that the myoma had some parasitic attachment to the bowel or omentum.  This site was found on the sigmoid colon epiploica and it was coagulated with harmonic Ace.   In order to ensure that there was no other site of bleeding from a potential attachment of the myoma we ran the bowel laparoscopically and did not find any other bleeding.  In the meantime adhesions between the transverse/sigmoid colonic flexure were lysed with harmonic Ace.   We then resumed the myomectomy by making a posterior uterine midline vertical incision after vasopressin injection and located a 2.5 cm intramural myoma and removed it.  The resulting defect was similarly closed in 3 layers with 2-0 Vicryl suture.  The first layer was continuous interlocking suture.  The second layer was a superficial myometrial continuous suture and the third layer was a 2-0 Vicryl continuous suture to approximate the serosa and the superficial muscularis. The upper abdomen and the pelvis were carefully inspected for adequate hemostasis.  The abdominal and pelvic cavities were copiously irrigated and aspirated.   The suprapubic fascial incision was closed with 2-0 Vicryl continuous suture. Subcutaneous tissue was irrigated and aspirated good hemostasis was achieved. A slurry of 2 sheets of Seprafilm in 60 mL of normal saline was injected as an adhesion barrier into the  pelvis. The gas was allowed to escape. The instrument and the lap pad count were correct. The trochars were removed. The skin incisions were approximated with Dermabond, except for the skin incision that belonged to the 4 cm lower transverse incision which was approximated with 4-0 Monocryl in subcuticular stitches.  The patient tolerated the procedure well and was transferred to recovery room in satisfactory condition.  SPECIAL NOTE: Because of the extent of the myometrial incision during the uterine myomectomy, it is recommended that this patient deliver by a cesarean section with her future pregnancies.  Governor Specking, MD

## 2017-10-29 NOTE — Anesthesia Procedure Notes (Signed)
Procedure Name: Intubation Date/Time: 10/29/2017 11:03 AM Performed by: Jonna Munro, CRNA Pre-anesthesia Checklist: Patient identified, Emergency Drugs available, Suction available, Patient being monitored and Timeout performed Patient Re-evaluated:Patient Re-evaluated prior to induction Oxygen Delivery Method: Circle system utilized Preoxygenation: Pre-oxygenation with 100% oxygen Induction Type: IV induction Ventilation: Mask ventilation without difficulty Laryngoscope Size: Mac and 3 Grade View: Grade I Tube type: Oral Tube size: 7.0 mm Number of attempts: 1 Airway Equipment and Method: Stylet Secured at: 22 cm Tube secured with: Tape Dental Injury: Teeth and Oropharynx as per pre-operative assessment

## 2017-10-29 NOTE — Anesthesia Postprocedure Evaluation (Signed)
Anesthesia Post Note  Patient: DANELLE CURIALE  Procedure(s) Performed: LAPAROSCOPIC GELPORT ASSISTED MYOMECTOMY, EXCISION OF ENDOMETRIOSIS, LYSIS OF ADHESIONS (N/A Abdomen)     Patient location during evaluation: PACU Anesthesia Type: General Level of consciousness: awake and alert Pain management: pain level controlled Vital Signs Assessment: post-procedure vital signs reviewed and stable Respiratory status: spontaneous breathing, nonlabored ventilation and respiratory function stable Cardiovascular status: blood pressure returned to baseline and stable Postop Assessment: no apparent nausea or vomiting Anesthetic complications: no    Last Vitals:  Vitals:   10/29/17 1421 10/29/17 1430  BP: 127/68 120/70  Pulse: (!) 104 (!) 101  Resp: 16 11  Temp: 37.3 C   SpO2: 100% 100%    Last Pain:  Vitals:   10/29/17 1421  TempSrc:   PainSc: 0-No pain                 Brennan Bailey

## 2017-10-29 NOTE — Transfer of Care (Signed)
Immediate Anesthesia Transfer of Care Note  Patient: Mallory Jimenez  Procedure(s) Performed: LAPAROSCOPIC GELPORT ASSISTED MYOMECTOMY, EXCISION OF ENDOMETRIOSIS, LYSIS OF ADHESIONS (N/A Abdomen)  Patient Location: PACU  Anesthesia Type:General  Level of Consciousness: awake, alert  and oriented  Airway & Oxygen Therapy: Patient Spontanous Breathing and Patient connected to nasal cannula oxygen  Post-op Assessment: Report given to RN and Post -op Vital signs reviewed and stable  Post vital signs: Reviewed and stable  Last Vitals:  Vitals Value Taken Time  BP    Temp    Pulse 104 10/29/2017  2:20 PM  Resp    SpO2 100 % 10/29/2017  2:20 PM  Vitals shown include unvalidated device data.  Last Pain:  Vitals:   10/29/17 0851  TempSrc:   PainSc: 0-No pain      Patients Stated Pain Goal: 5 (91/[id number]/82 [id number])  Complications: No apparent anesthesia complications

## 2017-10-29 NOTE — H&P (Signed)
Mallory Jimenez is a 47 y.o. female , originally referred to me by Dr. Quincy Simmonds for laparoscopic GelPort assisted myomectomy.  She was diagnosed with fibroids because of abnormal uterine bleeding. She has been having monthly periods but with heavy flow and prolonged duration.  Ultrasound showed a 10 cm pedunculated myoma as well as a 2.5 cm intramural myoma as her dominant fibroids.  Patient would like to preserve her childbearing potential.  She is currently not in a relationship and not trying to conceive.  Pertinent Gynecological History: Menses: flow is excessive with use of 3 pads or tampons on heaviest days Bleeding: dysfunctional uterine bleeding Contraception: none DES exposure: denies Blood transfusions: none Sexually transmitted diseases: no past history Last pap: normal   Menstrual History: Menarche age: 79   Past Medical History:  Diagnosis Date  . Abnormal pap    ASCUS as a teenager   . Fibroid 01/05/2016   3 fibroids with one large pedunculated fibroid.  Ultrasound yearly.  . Fibroids   . Irregular menses   . Lower back pain                     Past Surgical History:  Procedure Laterality Date  . WISDOM TOOTH EXTRACTION Bilateral age 41             Family History  Problem Relation Age of Onset  . Hypertension Mother   . Other Mother        benign abdominal tumor x 2  . Hypertension Father   . Hypertension Maternal Grandmother   . Hypertension Paternal Grandmother   . Lymphoma Cousin 30       Non hogkins lymphoma   No hereditary disease.  No cancer of breast, ovary, uterus. No cutaneous leiomyomatosis or renal cell carcinoma.  Social History   Socioeconomic History  . Marital status: Single    Spouse name: Not on file  . Number of children: 0  . Years of education: Not on file  . Highest education level: Not on file  Occupational History    Employer: [employer]  Social Needs  . Financial resource strain: Not on file  . Food insecurity:   Worry: Not on file    Inability: Not on file  . Transportation needs:    Medical: Not on file    Non-medical: Not on file  Tobacco Use  . Smoking status: Never Smoker  . Smokeless tobacco: Never Used  Substance and Sexual Activity  . Alcohol use: No  . Drug use: No  . Sexual activity: Not Currently    Partners: Male    Birth control/protection: Pill    Comment: [place]  Lifestyle  . Physical activity:    Days per week: Not on file    Minutes per session: Not on file  . Stress: Not on file  Relationships  . Social connections:    Talks on phone: Not on file    Gets together: Not on file    Attends religious service: Not on file    Active member of club or organization: Not on file    Attends meetings of clubs or organizations: Not on file    Relationship status: Not on file  . Intimate partner violence:    Fear of current or ex partner: Not on file    Emotionally abused: Not on file    Physically abused: Not on file    Forced sexual activity: Not on file  Other Topics Concern  .  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    No Known Allergies  No current facility-administered medications on file prior to encounter.    Current Outpatient Medications on File Prior to Encounter  Medication Sig Dispense Refill  . aspirin EC 81 MG tablet Take 81 mg by mouth 2 (two) times a week.    . Cholecalciferol (VITAMIN D3) 2000 units capsule Take 2,000 Units by mouth daily.    Marland Kitchen ibuprofen (ADVIL,MOTRIN) 200 MG tablet Take 400 mg by mouth every 8 (eight) hours as needed for mild pain.     . montelukast (SINGULAIR) 10 MG tablet Take 10 mg by mouth at bedtime.     . vitamin C (ASCORBIC ACID) 500 MG tablet Take 500 mg by mouth daily as needed.     . Vitamin D, Ergocalciferol, (DRISDOL) 50000 UNITS CAPS capsule Take 1 capsule by mouth once a week.    . Biotin 1000 MCG tablet Take 1,000 mcg by mouth daily.      . Calcium Carb-Cholecalciferol (CALCIUM 600/VITAMIN D3) 600-800 MG-UNIT TABS Take 1 tablet by mouth daily.    . fluticasone (FLONASE) 50 MCG/ACT nasal spray Place 2 sprays into the nose daily as needed for allergies.     . SUMAtriptan (IMITREX) 25 MG tablet Take 1 tablet by mouth every 2 (two) hours as needed for migraine.   0     Review of Systems  Constitutional: Negative.   HENT: Negative.   Eyes: Negative.   Respiratory: Negative.   Cardiovascular: Negative.   Gastrointestinal: Negative.   Genitourinary: Negative.   Musculoskeletal: Negative.   Skin: Negative.   Neurological: Negative.   Endo/Heme/Allergies: Negative.   Psychiatric/Behavioral: Negative.      Physical Exam  BP 122/75   Pulse 76   Temp 98.2 F (36.8 C) (Oral)   Resp 16   Ht 5\' 7"  (1.702 m)   Wt 72.3 kg   LMP 10/16/2017   SpO2 100%   BMI 24.95 kg/m  Constitutional: She is oriented to person, place, and time. She appears well-developed and well-nourished.  HENT:  Head: Normocephalic and atraumatic.  Nose: Nose normal.  Mouth/Throat: Oropharynx is clear and moist. No oropharyngeal exudate.  Eyes: Conjunctivae normal and EOM are normal. Pupils are equal, round, and reactive to light. No scleral icterus.  Neck: Normal range of motion. Neck supple. No tracheal deviation present. No thyromegaly present.  Cardiovascular: Normal rate.   Respiratory: Effort normal and breath sounds normal.  GI: Soft. Bowel sounds are normal. She exhibits no distension and no mass. There is no tenderness.  Lymphadenopathy:    She has no cervical adenopathy.  Neurological: She is alert and oriented to person, place, and time. She has normal reflexes.  Skin: Skin is warm.  Psychiatric: She has a normal mood and affect. Her behavior is normal. Judgment and thought content normal.    Assessment/Plan:  Intramural and subserosal uterine myomas, causing menorrhagia and pressure sensation. Preoperative for laparoscopic GelPort  assisted myomectomy Benefits and risks of laparoscopic myomectomy were discussed with the patient and her family member again.  Bowel prep instructions were given.  All of patient's questions were answered.  She verbalized understanding.  She knows that she will need a cesarean delivery for future pregnancies, and that it is recommended she does not conceive for 2-3 months for uterus to heal.  Governor Specking, MD

## 2017-10-29 NOTE — Discharge Instructions (Signed)
°  Post Anesthesia Home Care Instructions  Activity: Get plenty of rest for the remainder of the day. A responsible adult should stay with you for 24 hours following the procedure.  For the next 24 hours, DO NOT: -Drive a car -Paediatric nurse -Drink alcoholic beverages -Take any medication unless instructed by your physician -Make any legal decisions or sign important papers.  Meals: Start with liquid foods such as gelatin or soup. Progress to regular foods as tolerated. Avoid greasy, spicy, heavy foods. If nausea and/or vomiting occur, drink only clear liquids until the nausea and/or vomiting subsides. Call your physician if vomiting continues.  Special Instructions/Symptoms: Your throat may feel dry or sore from the anesthesia or the breathing tube placed in your throat during surgery. If this causes discomfort, gargle with warm salt water. The discomfort should disappear within 24 hours.  If you had a scopolamine patch placed behind your ear for the management of post- operative nausea and/or vomiting:  1. The medication in the patch is effective for 72 hours, after which it should be removed.  Wrap patch in a tissue and discard in the trash. Wash hands thoroughly with soap and water. 2. You may remove the patch earlier than 72 hours if you experience unpleasant side effects which may include dry mouth, dizziness or visual disturbances. 3. Avoid touching the patch. Wash your hands with soap and water after contact with the patch.   HOME CARE INSTRUCTIONS - LAPAROSCOPY  Wound Care: The bandaids or dressing which are placed over the skin openings may be removed the day after surgery. The incision should be kept clean and dry. The stitches do not need to be removed. Should the incision become sore, red, and swollen after the first week, check with your doctor.  Personal Hygiene: Shower the day after your procedure. Always wipe from front to back after elimination.   Activity: Do not  drive or operate any equipment today. The effects of the anesthesia are still present and drowsiness may result. Rest today, not necessarily flat bed rest, just take it easy. You may resume your normal activity in one to three days or as instructed by your physician.  Sexual Activity: You resume sexual activity as indicated by your physician.  Diet: Eat a light diet as desired this evening. You may resume a regular diet tomorrow.  Return to Work: Two to three days or as indicated by your doctor.  Expectations After Surgery: Your surgery will cause vaginal drainage or spotting which may continue for 2-3 days. Mild abdominal discomfort or tenderness is not unusual and some shoulder pain may also be noted which can be relieved by lying flat in pain.  Call Your Doctor If these Occur:  Persistent or heavy bleeding at incision site       Redness or swelling around incision       Elevation of temperature greater than 100 degrees F

## 2017-10-30 ENCOUNTER — Encounter (HOSPITAL_BASED_OUTPATIENT_CLINIC_OR_DEPARTMENT_OTHER): Payer: Self-pay | Admitting: Obstetrics and Gynecology

## 2018-06-27 ENCOUNTER — Ambulatory Visit: Payer: BC Managed Care – PPO | Admitting: Obstetrics and Gynecology

## 2018-07-22 ENCOUNTER — Other Ambulatory Visit: Payer: Self-pay | Admitting: Obstetrics and Gynecology

## 2018-07-22 NOTE — Telephone Encounter (Signed)
Medication refill request: Mallory Jimenez  Last AEX:  06-14-17 BS Next AEX: 07-30-2018 Last MMG (if hormonal medication request): n/a Refill authorized: Today, please advise.   Medication pended for #28, 0RF. Please refill if appropriate.

## 2018-07-28 ENCOUNTER — Other Ambulatory Visit: Payer: Self-pay

## 2018-07-29 ENCOUNTER — Other Ambulatory Visit: Payer: Self-pay

## 2018-07-29 ENCOUNTER — Telehealth: Payer: Self-pay | Admitting: Obstetrics and Gynecology

## 2018-07-29 NOTE — Telephone Encounter (Signed)
Covid prescreen positive. Patient states she has a cough due to allergies. Patient sounded congested over the phone.

## 2018-07-29 NOTE — Telephone Encounter (Signed)
Call to patient. Patient states she had Covid testing done the end of June that was negative. Patient complaining of runny nose, sneezing, itchy eyes, pain in ears and dizziness. States she has not been in contact with anyone with Covid. Has called PCP x2. Currently on prednisone taper, singulair, sudafed and claritin. Also has prescription for z pack from PCP. Patient states she is okay to reschedule aex that was scheduled for 07-30-2018. AEX rescheduled to 09-05-2018 at 1230. Patient agreeable to date and time of appointment.   Routing to provider and will close encounter.

## 2018-07-30 ENCOUNTER — Ambulatory Visit: Payer: BC Managed Care – PPO | Admitting: Obstetrics and Gynecology

## 2018-08-12 ENCOUNTER — Other Ambulatory Visit: Payer: Self-pay | Admitting: Obstetrics and Gynecology

## 2018-08-13 MED ORDER — LEVONORGESTREL-ETHINYL ESTRAD 0.15-30 MG-MCG PO TABS: 1.0000 | ORAL_TABLET | Freq: Every day | ORAL | 0 refills | Status: DC

## 2018-08-13 NOTE — Telephone Encounter (Signed)
Forwarding to Dr. Quincy Simmonds

## 2018-09-05 ENCOUNTER — Ambulatory Visit: Payer: BC Managed Care – PPO | Admitting: Obstetrics and Gynecology

## 2018-09-07 ENCOUNTER — Other Ambulatory Visit: Payer: Self-pay | Admitting: Obstetrics and Gynecology

## 2018-09-08 NOTE — Telephone Encounter (Addendum)
Medication refill request: Mallory Jimenez  Last AEX:  06/14/17 BS Next AEX: 10/15/18 Last MMG (if hormonal medication request): 03/12/17 BIRADS 1 negative/density b -- patient states that she had to cancel due to [place]  Refill authorized: Last filled 08/13/18 #28 w/0 refills; today please advise, order pended #28 w/1 refills if authorized

## 2018-10-10 ENCOUNTER — Other Ambulatory Visit: Payer: Self-pay

## 2018-10-15 ENCOUNTER — Ambulatory Visit: Payer: BC Managed Care – PPO | Admitting: Obstetrics and Gynecology

## 2018-10-15 ENCOUNTER — Encounter: Payer: Self-pay | Admitting: Obstetrics and Gynecology

## 2018-10-15 ENCOUNTER — Encounter

## 2018-10-15 ENCOUNTER — Other Ambulatory Visit: Payer: Self-pay

## 2018-10-15 VITALS — BP 134/70 | HR 72 | Temp 98.0°F | Resp 12 | Ht 66.75 in | Wt 162.4 lb

## 2018-10-15 DIAGNOSIS — Z1211 Encounter for screening for malignant neoplasm of colon: Secondary | ICD-10-CM | POA: Diagnosis not present

## 2018-10-15 DIAGNOSIS — Z01419 Encounter for gynecological examination (general) (routine) without abnormal findings: Secondary | ICD-10-CM | POA: Diagnosis not present

## 2018-10-15 MED ORDER — LEVONORGESTREL-ETHINYL ESTRAD 0.15-30 MG-MCG PO TABS: 1.0000 | ORAL_TABLET | Freq: Every day | ORAL | 3 refills | Status: DC

## 2018-10-15 NOTE — Patient Instructions (Signed)

## 2018-10-15 NOTE — Progress Notes (Signed)
48 y.o. G0P0000 Single African American female here for annual exam.    No more back pain since she had laparoscopic myomectomy with Dr. Kerin Perna.   She is asking about her fertility.  She has a low AMH.   States her menses are still occurring but much less bleeding and cramping.  Dow feel increased heat.  She is on COCs.   She brings in a copy of her labs to be scanned in.  PCP: Nicholos Johns, MD   Patient's last menstrual period was 10/07/2018.           Sexually active: No.  The current method of family planning is OCP (estrogen/progesterone).    Exercising: Yes.    workout videos Smoker:  no  Health Maintenance: Pap:  06/08/16 pap and HR HPV negative History of abnormal Pap:  Yes, in her teens MMG:  03/12/17 BIRADS 1 negative/density  b -- scheduled November 2020 Colonoscopy:  n/a BMD:   n/a  Result  n/a TDaP:  01/30/2007.  Unsure if she has had it since then.  Gardasil:   no HIV: 06/06/15 Negative Hep C: never Screening Labs: done with PCP Flu vaccine:  Recommended.   reports that she has never smoked. She has never used smokeless tobacco. She reports that she does not drink alcohol or use drugs.  Past Medical History:  Diagnosis Date  . Abnormal pap    ASCUS as a teenager   . Fibroid 01/05/2016   3 fibroids with one large pedunculated fibroid.  Ultrasound yearly.  . Fibroids   . Irregular menses   . Lower back pain   . Migraine    without aura    Past Surgical History:  Procedure Laterality Date  . LAPAROSCOPIC GELPORT ASSISTED MYOMECTOMY N/A 10/29/2017   Procedure: LAPAROSCOPIC GELPORT ASSISTED MYOMECTOMY, EXCISION OF ENDOMETRIOSIS, LYSIS OF ADHESIONS;  Surgeon: Governor Specking, MD;  Location: [facility];  Service: Gynecology;  Laterality: N/A;  . WISDOM TOOTH EXTRACTION Bilateral age 30    Current Outpatient Medications  Medication Sig Dispense Refill  . aspirin EC 81 MG tablet Take 81 mg by mouth 2 (two) times a week.    . Calcium  Carb-Cholecalciferol (CALCIUM 600/VITAMIN D3) 600-800 MG-UNIT TABS Take 1 tablet by mouth daily.    . Cholecalciferol (VITAMIN D3) 2000 units capsule Take 2,000 Units by mouth daily.    Marland Kitchen estradiol (ESTRACE) 0.1 MG/GM vaginal cream Use 1 gm twice weekly (Patient taking differently: Place 1 Applicatorful vaginally daily as needed. ) 42.5 g 0  . fluticasone (FLONASE) 50 MCG/ACT nasal spray Place 2 sprays into the nose daily as needed for allergies.     Marland Kitchen ibuprofen (ADVIL,MOTRIN) 200 MG tablet Take 400 mg by mouth every 8 (eight) hours as needed for mild pain.     Marland Kitchen KURVELO 0.15-30 MG-MCG tablet TAKE 1 TABLET BY MOUTH DAILY 28 tablet 1  . montelukast (SINGULAIR) 10 MG tablet Take 10 mg by mouth at bedtime.     . SUMAtriptan (IMITREX) 25 MG tablet Take 1 tablet by mouth every 2 (two) hours as needed for migraine.   0  . vitamin C (ASCORBIC ACID) 500 MG tablet Take 500 mg by mouth daily as needed.     . Vitamin D, Ergocalciferol, (DRISDOL) 50000 UNITS CAPS capsule Take 1 capsule by mouth once a week.     No current facility-administered medications for this visit.     Family History  Problem Relation Age of Onset  . Hypertension Mother   .  Other Mother        benign abdominal tumor x 2  . Hypertension Father   . Hypertension Maternal Grandmother   . Hypertension Paternal Grandmother   . Lymphoma Cousin 30       Non hogkins lymphoma    Review of Systems  Constitutional: Negative.   HENT: Negative.   Eyes: Negative.   Respiratory: Negative.   Cardiovascular: Negative.   Gastrointestinal: Negative.   Endocrine: Negative.   Genitourinary: Negative.   Musculoskeletal: Negative.   Skin: Negative.   Allergic/Immunologic: Negative.   Neurological: Negative.   Hematological: Negative.   Psychiatric/Behavioral: Negative.     Exam:   BP 134/70 (BP Location: Right Arm, Patient Position: Sitting, Cuff Size: Normal)   Pulse 72   Temp 98 F (36.7 C) (Temporal)   Resp 12   Ht 5' 6.75"  (1.695 m)   Wt 162 lb 6.4 oz (73.7 kg)   LMP 10/07/2018   BMI 25.63 kg/m     General appearance: alert, cooperative and appears stated age Head: normocephalic, without obvious abnormality, atraumatic Neck: no adenopathy, supple, symmetrical, trachea midline and thyroid normal to inspection and palpation Lungs: clear to auscultation bilaterally Breasts: normal appearance, no masses or tenderness, No nipple retraction or dimpling, No nipple discharge or bleeding, No axillary adenopathy Heart: regular rate and rhythm Abdomen: soft, non-tender; no masses, no organomegaly Extremities: extremities normal, atraumatic, no cyanosis or edema Skin: skin color, texture, turgor normal. No rashes or lesions Lymph nodes: cervical, supraclavicular, and axillary nodes normal. Neurologic: grossly normal  Pelvic: External genitalia:  no lesions              No abnormal inguinal nodes palpated.              Urethra:  normal appearing urethra with no masses, tenderness or lesions              Bartholins and Skenes: normal                 Vagina: normal appearing vagina with normal color and discharge, no lesions              Cervix: no lesions              Pap taken: No. Bimanual Exam:  Uterus:  normal size, contour, position, consistency, mobility, non-tender              Adnexa: no mass, fullness, tenderness              Rectal exam: Yes.  .  Confirms.              Anus:  normal sphincter tone, no lesions  Chaperone was present for exam.  Assessment:   Well woman visit with normal exam. Status post laparoscopic myomectomy. Hx migraine without aura.  Plan: Mammogram screening discussed. Self breast awareness reviewed. Pap and HR HPV as above. Guidelines for Calcium, Vitamin D, regular exercise program including cardiovascular and weight bearing exercise. Refill of OCPs for one year.  We discussed future fertility.   Follow up annually and prn.      After visit summary provided.

## 2018-11-03 ENCOUNTER — Other Ambulatory Visit: Payer: Self-pay | Admitting: Obstetrics and Gynecology

## 2019-02-12 ENCOUNTER — Encounter: Payer: Self-pay | Admitting: Internal Medicine

## 2019-06-03 ENCOUNTER — Ambulatory Visit: Payer: BC Managed Care – PPO | Admitting: Cardiology

## 2019-06-03 ENCOUNTER — Encounter: Payer: Self-pay | Admitting: Cardiology

## 2019-06-03 ENCOUNTER — Ambulatory Visit (INDEPENDENT_AMBULATORY_CARE_PROVIDER_SITE_OTHER): Payer: BC Managed Care – PPO

## 2019-06-03 ENCOUNTER — Other Ambulatory Visit: Payer: Self-pay

## 2019-06-03 VITALS — BP 146/90 | HR 95 | Temp 97.9°F | Ht 66.75 in | Wt 169.0 lb

## 2019-06-03 DIAGNOSIS — R002 Palpitations: Secondary | ICD-10-CM

## 2019-06-03 DIAGNOSIS — Z3200 Encounter for pregnancy test, result unknown: Secondary | ICD-10-CM | POA: Diagnosis not present

## 2019-06-03 DIAGNOSIS — J302 Other seasonal allergic rhinitis: Secondary | ICD-10-CM

## 2019-06-03 DIAGNOSIS — R011 Cardiac murmur, unspecified: Secondary | ICD-10-CM

## 2019-06-03 DIAGNOSIS — R030 Elevated blood-pressure reading, without diagnosis of hypertension: Secondary | ICD-10-CM

## 2019-06-03 HISTORY — DX: Elevated blood-pressure reading, without diagnosis of hypertension: R03.0

## 2019-06-03 HISTORY — DX: Palpitations: R00.2

## 2019-06-03 HISTORY — DX: Other seasonal allergic rhinitis: J30.2

## 2019-06-03 HISTORY — DX: Cardiac murmur, unspecified: R01.1

## 2019-06-03 NOTE — Patient Instructions (Addendum)
Medication Instructions:  No medication changes *If you need a refill on your cardiac medications before your next appointment, please call your pharmacy*   Lab Work: None ordered If you have labs (blood work) drawn today and your tests are completely normal, you will receive your results only by: Marland Kitchen MyChart Message (if you have MyChart) OR . A paper copy in the mail If you have any lab test that is abnormal or we need to change your treatment, we will call you to review the results.   Testing/Procedures: We will order CT coronary calcium score $150  Please call [phone] to schedule   [facility]  [address]  [place] [address]   WHY IS MY DOCTOR PRESCRIBING ZIO? The Zio system is proven and trusted by physicians to detect and diagnose irregular heart rhythms -- and has been prescribed to hundreds of thousands of patients.  The FDA has cleared the Zio system to monitor for many different kinds of irregular heart rhythms. In a study, physicians were able to reach a diagnosis 90% of the time with the Zio system1.  You can wear the Zio monitor -- a small, discreet, comfortable patch -- during your normal day-to-day activity, including while you sleep, shower, and exercise, while it records every single heartbeat for analysis.  1Barrett, P., et al. Comparison of 24 Hour Holter Monitoring Versus 14 Day Novel Adhesive Patch Electrocardiographic Monitoring. [place], 2014.  ZIO VS. HOLTER MONITORING The Zio monitor can be comfortably worn for up to 14 days. Holter monitors can be worn for 24 to 48 hours, limiting the time to record any irregular heart rhythms you may have. Zio is able to capture data for the 51% of patients who have their first symptom-triggered arrhythmia after 48 hours.1  LIVE WITHOUT RESTRICTIONS The Zio ambulatory cardiac monitor is a small, unobtrusive, and water-resistant patch--you might even forget you're wearing it.  The Zio monitor records and stores every beat of your heart, whether you're sleeping, working out, or showering.  Wear the monitor for 2 weeks and remove 06/17/19.  Your physician has requested that you have an echocardiogram. Echocardiography is a painless test that uses sound waves to create images of your heart. It provides your doctor with information about the size and shape of your heart and how well your heart's chambers and valves are working. This procedure takes approximately one hour. There are no restrictions for this procedure. '   Follow-Up: At [facility], you and your health needs are our priority.  As part of our continuing mission to provide you with exceptional heart care, we have created designated Provider Care Teams.  These Care Teams include your primary Cardiologist (physician) and Advanced Practice Providers (APPs -  Physician Assistants and Nurse Practitioners) who all work together to provide you with the care you need, when you need it.  We recommend signing up for the patient portal called "MyChart".  Sign up information is provided on this After Visit Summary.  MyChart is used to connect with patients for Virtual Visits (Telemedicine).  Patients are able to view lab/test results, encounter notes, upcoming appointments, etc.  Non-urgent messages can be sent to your provider as well.   To learn more about what you can do with MyChart, go to [email or web address].    Your next appointment:   3 month(s)  The format for your next appointment:   In Person  Provider:   Jyl Heinz, MD   Other  Instructions  Echocardiogram An echocardiogram is a procedure that uses painless sound waves (ultrasound) to produce an image of the heart. Images from an echocardiogram can provide important information about:  Signs of coronary artery disease (CAD).  Aneurysm detection. An aneurysm is a weak or damaged part of an artery wall that bulges out from the normal force of  blood pumping through the body.  Heart size and shape. Changes in the size or shape of the heart can be associated with certain conditions, including heart failure, aneurysm, and CAD.  Heart muscle function.  Heart valve function.  Signs of a past heart attack.  Fluid buildup around the heart.  Thickening of the heart muscle.  A tumor or infectious growth around the heart valves. Tell a health care provider about:  Any allergies you have.  All medicines you are taking, including vitamins, herbs, eye drops, creams, and over-the-counter medicines.  Any blood disorders you have.  Any surgeries you have had.  Any medical conditions you have.  Whether you are pregnant or may be pregnant. What are the risks? Generally, this is a safe procedure. However, problems may occur, including:  Allergic reaction to dye (contrast) that may be used during the procedure. What happens before the procedure? No specific preparation is needed. You may eat and drink normally. What happens during the procedure?   An IV tube may be inserted into one of your veins.  You may receive contrast through this tube. A contrast is an injection that improves the quality of the pictures from your heart.  A gel will be applied to your chest.  A wand-like tool (transducer) will be moved over your chest. The gel will help to transmit the sound waves from the transducer.  The sound waves will harmlessly bounce off of your heart to allow the heart images to be captured in real-time motion. The images will be recorded on a computer. The procedure may vary among health care providers and hospitals. What happens after the procedure?  You may return to your normal, everyday life, including diet, activities, and medicines, unless your health care provider tells you not to do that. Summary  An echocardiogram is a procedure that uses painless sound waves (ultrasound) to produce an image of the heart.  Images  from an echocardiogram can provide important information about the size and shape of your heart, heart muscle function, heart valve function, and fluid buildup around your heart.  You do not need to do anything to prepare before this procedure. You may eat and drink normally.  After the echocardiogram is completed, you may return to your normal, everyday life, unless your health care provider tells you not to do that. This information is not intended to replace advice given to you by your health care provider. Make sure you discuss any questions you have with your health care provider. Document Revised: 05/01/2018 Document Reviewed: 02/11/2016 Elsevier Patient Education  [place].    Blood Pressure Record Sheet To take your blood pressure, you will need a blood pressure machine. You can buy a blood pressure machine (blood pressure monitor) at your clinic, drug store, or online. When choosing one, consider:  An automatic monitor that has an arm cuff.  A cuff that wraps snugly around your upper arm. You should be able to fit only one finger between your arm and the cuff.  A device that stores blood pressure reading results.  Do not choose a monitor that measures your blood pressure from  your wrist or finger. Follow your health care provider's instructions for how to take your blood pressure. To use this form:  Get one reading in the morning (a.m.) before you take any medicines.  Get one reading in the evening (p.m.) before supper.  Take at least 2 readings with each blood pressure check. This makes sure the results are correct. Wait 1-2 minutes between measurements.  Write down the results in the spaces on this form.  Repeat this once a week, or as told by your health care provider.  Make a follow-up appointment with your health care provider to discuss the results. Blood pressure log Date: _______________________  a.m. _____________________(1st reading)  _____________________(2nd reading)  p.m. _____________________(1st reading) _____________________(2nd reading) Date: _______________________  a.m. _____________________(1st reading) _____________________(2nd reading)  p.m. _____________________(1st reading) _____________________(2nd reading) Date: _______________________  a.m. _____________________(1st reading) _____________________(2nd reading)  p.m. _____________________(1st reading) _____________________(2nd reading) Date: _______________________  a.m. _____________________(1st reading) _____________________(2nd reading)  p.m. _____________________(1st reading) _____________________(2nd reading) Date: _______________________  a.m. _____________________(1st reading) _____________________(2nd reading)  p.m. _____________________(1st reading) _____________________(2nd reading) This information is not intended to replace advice given to you by your health care provider. Make sure you discuss any questions you have with your health care provider. Document Revised: 03/08/2017 Document Reviewed: 01/08/2017 Elsevier Patient Education  [place].

## 2019-06-03 NOTE — Progress Notes (Signed)
Cardiology Office Note:    Date:  06/03/2019   ID:  Mallory Jimenez, DOB Nov 20, 1970, MRN [id number]  PCP:  Nicholos Johns, MD  Cardiologist:  Jenean Lindau, MD   Referring MD: Nicholos Johns, MD    ASSESSMENT:    1. Palpitations   2. Elevated blood pressure reading in office without diagnosis of hypertension   3. Cardiac murmur    PLAN:    In order of problems listed above:  1. Primary prevention stressed with the patient.  Importance of compliance with diet and medication stressed and she vocalized understanding.  I mentioned to her to begin an active lifestyle and walking on a regular basis. 2. Palpitations: I reviewed blood work and records from primary care physician.  They are unremarkable.  We will do a 2-week ZIO monitoring to assess the symptoms. 3. Cardiac murmur: Echocardiogram will be done to assess murmur heard on auscultation. 4. Elevated blood pressure in the office today: She is a Designer, jewellery.  She does not know of any hypertension.  It is possible that she has an element of whitecoat hypertension and she will keep a track of blood pressures twice a day and bring it to me.  She will be seen follow-up appointment in 3 months or earlier if she has any concerns.  She knows to go to nearest emergency room for any concerning symptoms. 5. Restratification: I suggested calcium CT scoring and she is agreeable and we will set it up for her.  This will help me to stratify for cardiovascular events in the near future. 6. Patient had multiple questions which were answered to her satisfaction.   Medication Adjustments/Labs and Tests Ordered: Current medicines are reviewed at length with the patient today.  Concerns regarding medicines are outlined above.  No orders of the defined types were placed in this encounter.  No orders of the defined types were placed in this encounter.    History of Present Illness:    Mallory Jimenez is a 49 y.o. female who is being seen  today for the evaluation of palpitations at the request of Nicholos Johns, MD.  Patient is a pleasant 49 year old female.  She has no significant past medical history.  She mentions to me that she feels her heart go fast at times.  She has not really checked her heart rate.  She is a Designer, jewellery.  She mentions to me that she has no pain and then when she falls asleep when she wakes up in the morning she feels her heart is sore.  No orthopnea or PND.  She leads a sedentary lifestyle.  She takes care of activities of daily living without any problems.  She walks up flights of stairs without any issues.  At the time of my evaluation, the patient is alert awake oriented and in no distress.  Past Medical History:  Diagnosis Date  . Abnormal pap    ASCUS as a teenager   . Fibroid 01/05/2016   3 fibroids with one large pedunculated fibroid.  Ultrasound yearly.  . Fibroids   . Hyperlipidemia   . Irregular menses   . Lower back pain   . Migraine    without aura  . Vitamin D deficiency     Past Surgical History:  Procedure Laterality Date  . LAPAROSCOPIC GELPORT ASSISTED MYOMECTOMY N/A 10/29/2017   Procedure: LAPAROSCOPIC GELPORT ASSISTED MYOMECTOMY, EXCISION OF ENDOMETRIOSIS, LYSIS OF ADHESIONS;  Surgeon: Governor Specking, MD;  [facility];  Service: Gynecology;  Laterality: N/A;  . WISDOM TOOTH EXTRACTION Bilateral age 53    Current Medications: Current Meds  Medication Sig  . acetaminophen (ACETAMINOPHEN 8 HOUR) 650 MG CR tablet Take 650 mg by mouth in the morning, at noon, in the evening, and at bedtime. Take 1-2 tablets QID PRN  . amoxicillin-clavulanate (AUGMENTIN) 875-125 MG tablet Take 1 tablet by mouth 2 (two) times daily.  Marland Kitchen aspirin EC 81 MG tablet Take 81 mg by mouth 2 (two) times a week.  . Calcium Carb-Cholecalciferol (CALCIUM 600/VITAMIN D3) 600-800 MG-UNIT TABS Take 1 tablet by mouth daily.  . Cholecalciferol (VITAMIN D3) 2000 units capsule Take 2,000  Units by mouth daily.  . citalopram (CELEXA) 10 MG tablet Take 10 mg by mouth daily.  Marland Kitchen estradiol (ESTRACE) 0.1 MG/GM vaginal cream Use 1 gm twice weekly (Patient taking differently: Place 1 Applicatorful vaginally daily as needed. )  . fluticasone (FLONASE) 50 MCG/ACT nasal spray Place 2 sprays into the nose daily as needed for allergies.   Marland Kitchen ibuprofen (ADVIL,MOTRIN) 200 MG tablet Take 400 mg by mouth every 8 (eight) hours as needed for mild pain.   Marland Kitchen levonorgestrel-ethinyl estradiol (KURVELO) 0.15-30 MG-MCG tablet Take 1 tablet by mouth daily.  . meclizine (ANTIVERT) 12.5 MG tablet Take 12.5 mg by mouth 2 (two) times daily. As needed for dizziness/vertigo.  . montelukast (SINGULAIR) 10 MG tablet Take 10 mg by mouth at bedtime.   . Multiple Vitamin (MULTIVITAMIN) tablet Take 1 tablet by mouth daily.  . SUMAtriptan (IMITREX) 25 MG tablet Take 1 tablet by mouth every 2 (two) hours as needed for migraine.   . vitamin C (ASCORBIC ACID) 500 MG tablet Take 500 mg by mouth daily as needed.   . Vitamin D, Ergocalciferol, (DRISDOL) 50000 UNITS CAPS capsule Take 1 capsule by mouth once a week.     Allergies:   Patient has no known allergies.   Social History   Socioeconomic History  . Marital status: Single    Spouse name: Not on file  . Number of children: 0  . Years of education: Not on file  . Highest education level: Not on file  Occupational History    Employer: [employer]  Tobacco Use  . Smoking status: Never Smoker  . Smokeless tobacco: Never Used  Substance and Sexual Activity  . Alcohol use: No  . Drug use: No  . Sexual activity: Not Currently    Partners: Male    Birth control/protection: Pill    Comment: [place]velo  Other Topics Concern  . 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   Social Determinants of Health   Financial Resource Strain:   . Difficulty of Paying Living Expenses:   Food  Insecurity:   . Worried About Charity fundraiser in the Last Year:   . Arboriculturist in the Last Year:   Transportation Needs:   . Film/video editor (Medical):   Marland Kitchen Lack of Transportation (Non-Medical):   Physical Activity:   . Days of Exercise per Week:   . Minutes of Exercise per Session:   Stress:   . Feeling of Stress :   Social Connections:   . Frequency of Communication with Friends and Family:   . Frequency of Social Gatherings with Friends and Family:   . Attends Religious Services:   . Active Member of Clubs or Organizations:   . Attends  Club or Organization Meetings:   Marland Kitchen Marital Status:      Family History: The patient's family history includes Hypertension in her father, maternal grandmother, mother, and paternal grandmother; Lymphoma (age of onset: 16) in her cousin; Other in her mother.  ROS:   Please see the history of present illness.    All other systems reviewed and are negative.  EKGs/Labs/Other Studies Reviewed:    The following studies were reviewed today: EKG reveals sinus rhythm and nonspecific ST-T changes.   Recent Labs: No results found for requested labs within last 8760 hours.  Recent Lipid Panel    Component Value Date/Time   CHOL 168 05/27/2012 0907   TRIG 54 05/27/2012 0907   HDL 55 05/27/2012 0907   CHOLHDL 3.1 05/27/2012 0907   VLDL 11 05/27/2012 0907   LDLCALC 102 (H) 05/27/2012 0907    Physical Exam:    VS:  BP (!) 146/90   Pulse 95   Temp 97.9 F (36.6 C)   Ht 5' 6.75" (1.695 m)   Wt 169 lb (76.7 kg)   SpO2 98%   BMI 26.67 kg/m     Wt Readings from Last 3 Encounters:  06/03/19 169 lb (76.7 kg)  10/15/18 162 lb 6.4 oz (73.7 kg)  10/29/17 159 lb 4.8 oz (72.3 kg)     GEN: Patient is in no acute distress HEENT: Normal NECK: No JVD; No carotid bruits LYMPHATICS: No lymphadenopathy CARDIAC: S1 S2 regular, 2/6 systolic murmur at the apex. RESPIRATORY:  Clear to auscultation without rales, wheezing or rhonchi    ABDOMEN: Soft, non-tender, non-distended MUSCULOSKELETAL:  No edema; No deformity  SKIN: Warm and dry NEUROLOGIC:  Alert and oriented x 3 PSYCHIATRIC:  Normal affect    Signed, Jenean Lindau, MD  06/03/2019 2:01 PM    [place]

## 2019-06-19 ENCOUNTER — Other Ambulatory Visit: Payer: Self-pay

## 2019-06-19 ENCOUNTER — Ambulatory Visit (INDEPENDENT_AMBULATORY_CARE_PROVIDER_SITE_OTHER): Payer: BC Managed Care – PPO

## 2019-06-19 DIAGNOSIS — R011 Cardiac murmur, unspecified: Secondary | ICD-10-CM

## 2019-06-19 NOTE — Progress Notes (Unsigned)
Complete echocardiogram has been performed.  Jimmy Talon Regala RDCS, RVT 

## 2019-06-20 LAB — PREGNANCY, URINE: Preg Test, Ur: NEGATIVE

## 2019-06-28 ENCOUNTER — Emergency Department (HOSPITAL_COMMUNITY)
Admission: EM | Admit: 2019-06-28 | Discharge: 2019-06-29 | Disposition: A | Discharge status: Home / Self Care | Payer: BC Managed Care – PPO | Attending: Emergency Medicine | Admitting: Emergency Medicine

## 2019-06-28 ENCOUNTER — Other Ambulatory Visit: Payer: Self-pay

## 2019-06-28 ENCOUNTER — Emergency Department (HOSPITAL_COMMUNITY): Payer: BC Managed Care – PPO

## 2019-06-28 ENCOUNTER — Encounter (HOSPITAL_COMMUNITY): Payer: Self-pay | Admitting: Emergency Medicine

## 2019-06-28 DIAGNOSIS — R0782 Intercostal pain: Secondary | ICD-10-CM

## 2019-06-28 DIAGNOSIS — M25519 Pain in unspecified shoulder: Secondary | ICD-10-CM | POA: Diagnosis not present

## 2019-06-28 DIAGNOSIS — N926 Irregular menstruation, unspecified: Secondary | ICD-10-CM | POA: Diagnosis not present

## 2019-06-28 DIAGNOSIS — R079 Chest pain, unspecified: Secondary | ICD-10-CM | POA: Insufficient documentation

## 2019-06-28 DIAGNOSIS — Z7982 Long term (current) use of aspirin: Secondary | ICD-10-CM | POA: Insufficient documentation

## 2019-06-28 DIAGNOSIS — R002 Palpitations: Secondary | ICD-10-CM | POA: Diagnosis present

## 2019-06-28 DIAGNOSIS — M545 Low back pain: Secondary | ICD-10-CM | POA: Insufficient documentation

## 2019-06-28 LAB — CBC
HCT: 42.2 % (ref 36.0–46.0)
Hemoglobin: 13.5 g/dL (ref 12.0–15.0)
MCH: 30.2 pg (ref 26.0–34.0)
MCHC: 32 g/dL (ref 30.0–36.0)
MCV: 94.4 fL (ref 80.0–100.0)
Platelets: 259 10*3/uL (ref 150–400)
RBC: 4.47 MIL/uL (ref 3.87–5.11)
RDW: 13.3 % (ref 11.5–15.5)
WBC: 7.8 10*3/uL (ref 4.0–10.5)
nRBC: 0 % (ref 0.0–0.2)

## 2019-06-28 LAB — I-STAT BETA HCG BLOOD, ED (NOT ORDERABLE): I-stat hCG, quantitative: 60.9 m[IU]/mL — ABNORMAL HIGH (ref <5)

## 2019-06-28 LAB — HCG, QUANTITATIVE, PREGNANCY: hCG, Beta Chain, Quant, S: <1 m[IU]/mL (ref <5)

## 2019-06-28 LAB — BASIC METABOLIC PANEL
Anion gap: 12 (ref 5–15)
BUN: 15 mg/dL (ref 6–20)
CO2: 24 mmol/L (ref 22–32)
Calcium: 8.9 mg/dL (ref 8.9–10.3)
Chloride: 104 mmol/L (ref 98–111)
Creatinine, Ser: 0.82 mg/dL (ref 0.44–1.00)
GFR calc Af Amer: >60 mL/min (ref >60)
GFR calc non Af Amer: >60 mL/min (ref >60)
Glucose, Bld: 80 mg/dL (ref 70–99)
Potassium: 3.6 mmol/L (ref 3.5–5.1)
Sodium: 140 mmol/L (ref 135–145)

## 2019-06-28 LAB — TROPONIN I (HIGH SENSITIVITY)
Troponin I (High Sensitivity): <2 ng/L (ref <18)
Troponin I (High Sensitivity): <2 ng/L (ref <18)

## 2019-06-28 MED ORDER — ONDANSETRON HCL 4 MG/2ML IJ SOLN
4.0000 mg | Freq: Once | INTRAMUSCULAR | Status: AC
  Administered 2019-06-28: 4 mg via INTRAVENOUS
  Filled 2019-06-28: qty 2

## 2019-06-28 MED ORDER — SODIUM CHLORIDE 0.9% FLUSH: 3.0000 mL | Freq: Once | INTRAVENOUS | Status: DC

## 2019-06-28 NOTE — ED Provider Notes (Signed)
[facility] DEPT Provider Note   CSN: [id number] Arrival date & time: 06/28/19  1843     History Chief Complaint  Patient presents with  . Chest Pain  . Palpitations    Mallory Jimenez is a 49 y.o. female.  HPI Patient with no significant PMH reports several weeks of intermittent tachypalpitations, not really having much chest pain. She has been to see [facility]. She had an Echo which was neg and wore a ZIO patch but has not gotten the results of that. She was scheduled to have a Coronary CT (had a neg pregnancy test for this on May 29). She was started on Metoprolol by her PCP 2 days ago and has noted improvement in her palpitations. Today though she has had moderate aching L back and scapular/shoulder pain. No recent heavy lifting or injuries.      Past Medical History:  Diagnosis Date  . Abnormal pap    ASCUS as a teenager   . Fibroid 01/05/2016   3 fibroids with one large pedunculated fibroid.  Ultrasound yearly.  . Fibroids   . Hyperlipidemia   . Irregular menses   . Lower back pain   . Migraine    without aura  . Vitamin D deficiency     Patient Active Problem List   Diagnosis Date Noted  . Seasonal allergies 06/03/2019  . Palpitations 06/03/2019  . Elevated blood pressure reading in office without diagnosis of hypertension 06/03/2019  . Cardiac murmur 06/03/2019  . Fibroid uterus 10/25/2016    Past Surgical History:  Procedure Laterality Date  . LAPAROSCOPIC GELPORT ASSISTED MYOMECTOMY N/A 10/29/2017   Procedure: LAPAROSCOPIC GELPORT ASSISTED MYOMECTOMY, EXCISION OF ENDOMETRIOSIS, LYSIS OF ADHESIONS;  Surgeon: Governor Specking, MD;  Location: [facility];  Service: Gynecology;  Laterality: N/A;  . WISDOM TOOTH EXTRACTION Bilateral age 17     OB History    Gravida  0   Para  0   Term  0   Preterm  0   AB  0   Living  0     SAB  0   TAB  0   Ectopic  0   Multiple  0   Live Births  0             Family History  Problem Relation Age of Onset  . Hypertension Mother   . Other Mother        benign abdominal tumor x 2  . Hypertension Father   . Hypertension Maternal Grandmother   . Hypertension Paternal Grandmother   . Lymphoma Cousin 30       Non hogkins lymphoma    Social History   Tobacco Use  . Smoking status: Never Smoker  . Smokeless tobacco: Never Used  Substance Use Topics  . Alcohol use: No  . Drug use: No    Home Medications Prior to Admission medications   Medication Sig Start Date End Date Taking? Authorizing Provider  acetaminophen (ACETAMINOPHEN 8 HOUR) 650 MG CR tablet Take 650 mg by mouth in the morning, at noon, in the evening, and at bedtime. Take 1-2 tablets QID PRN    [provider]  amoxicillin-clavulanate (AUGMENTIN) 875-125 MG tablet Take 1 tablet by mouth 2 (two) times daily. 12/30/18   [provider]  aspirin EC 81 MG tablet Take 81 mg by mouth 2 (two) times a week.    [provider]  Calcium Carb-Cholecalciferol (CALCIUM 600/VITAMIN D3) 600-800 MG-UNIT TABS Take 1  tablet by mouth daily.    [provider]  Cholecalciferol (VITAMIN D3) 2000 units capsule Take 2,000 Units by mouth daily.    [provider]  citalopram (CELEXA) 10 MG tablet Take 10 mg by mouth daily. 02/12/19   [provider]  estradiol (ESTRACE) 0.1 MG/GM vaginal cream Use 1 gm twice weekly Patient taking differently: Place 1 Applicatorful vaginally daily as needed.  08/20/17   Nunzio Cobbs, MD  fluticasone (FLONASE) 50 MCG/ACT nasal spray Place 2 sprays into the nose daily as needed for allergies.     [provider]  ibuprofen (ADVIL,MOTRIN) 200 MG tablet Take 400 mg by mouth every 8 (eight) hours as needed for mild pain.     [provider]  levonorgestrel-ethinyl estradiol (KURVELO) 0.15-30 MG-MCG tablet Take 1 tablet by mouth daily. 10/15/18   Nunzio Cobbs, MD  meclizine  (ANTIVERT) 12.5 MG tablet Take 12.5 mg by mouth 2 (two) times daily. As needed for dizziness/vertigo.    [provider]  montelukast (SINGULAIR) 10 MG tablet Take 10 mg by mouth at bedtime.     [provider]  Multiple Vitamin (MULTIVITAMIN) tablet Take 1 tablet by mouth daily.    [provider]  SUMAtriptan (IMITREX) 25 MG tablet Take 1 tablet by mouth every 2 (two) hours as needed for migraine.  08/01/16   [provider]  vitamin C (ASCORBIC ACID) 500 MG tablet Take 500 mg by mouth daily as needed.     [provider]  Vitamin D, Ergocalciferol, (DRISDOL) 50000 UNITS CAPS capsule Take 1 capsule by mouth once a week. 04/15/13   [provider]    Allergies    Patient has no known allergies.  Review of Systems   Review of Systems A comprehensive review of systems was completed and negative except as noted in HPI.   Physical Exam Updated Vital Signs BP (!) 148/67   Pulse 84   Temp 98.1 F (36.7 C) (Oral)   Resp 16   LMP 06/07/2019   SpO2 100%   Physical Exam Vitals and nursing note reviewed.  Constitutional:      Appearance: Normal appearance.  HENT:     Head: Normocephalic and atraumatic.     Nose: Nose normal.     Mouth/Throat:     Mouth: Mucous membranes are moist.  Eyes:     Extraocular Movements: Extraocular movements intact.     Conjunctiva/sclera: Conjunctivae normal.  Cardiovascular:     Rate and Rhythm: Normal rate.  Pulmonary:     Effort: Pulmonary effort is normal.     Breath sounds: Normal breath sounds.  Abdominal:     General: Abdomen is flat.     Palpations: Abdomen is soft.     Tenderness: There is no abdominal tenderness.  Musculoskeletal:        General: No swelling. Normal range of motion.     Cervical back: Neck supple.     Comments: Mild tenderness of the L shoulder muscles  Skin:    General: Skin is warm and dry.  Neurological:     General: No focal deficit present.     Mental Status:  She is alert.  Psychiatric:        Mood and Affect: Mood normal.     ED Results / Procedures / Treatments   Labs (all labs ordered are listed, but only abnormal results are displayed) Labs Reviewed  I-STAT BETA HCG BLOOD, ED (NOT ORDERABLE) -  Abnormal; Notable for the following components:      Result Value   I-stat hCG, quantitative 60.9 (*)    All other components within normal limits  BASIC METABOLIC PANEL  CBC  HCG, QUANTITATIVE, PREGNANCY  I-STAT BETA HCG BLOOD, ED ([facility], [facility], [facility] ONLY)  TROPONIN I (HIGH SENSITIVITY)  TROPONIN I (HIGH SENSITIVITY)    EKG EKG Interpretation  Date/Time:  Sunday June 28 2019 18:53:12 EDT Ventricular Rate:  83 PR Interval:    QRS Duration: 63 QT Interval:  377 QTC Calculation: 443 R Axis:   55 Text Interpretation: Sinus rhythm Probable left atrial enlargement No old tracing to compare Reconfirmed by Aletta Edouard [phone]) on 06/28/2019 7:51:34 PM   Radiology DG Chest 2 View  Result Date: 06/28/2019 CLINICAL DATA:  Chest pain and palpitations, intermittent EXAM: CHEST - 2 VIEW COMPARISON:  Radiograph 03/03/2012 FINDINGS: No consolidation, features of edema, pneumothorax, or effusion. Pulmonary vascularity is normally distributed. The cardiomediastinal contours are unremarkable. No acute osseous or soft tissue abnormality. Telemetry leads overlie the chest. IMPRESSION: No acute cardiopulmonary abnormality. Electronically Signed   By: Lovena Le M.D.   On: 06/28/2019 19:25    Procedures Procedures (including critical care time)  Medications Ordered in ED Medications  sodium chloride flush (NS) 0.9 % injection 3 mL (has no administration in time range)    ED Course  I have reviewed the triage vital signs and the nursing notes.  Pertinent labs & imaging results that were available during my care of the patient were reviewed by me and considered in my medical decision making (see chart for details).  Clinical Course as of Jun 29 1039    Sun Jun 28, 2019  2223 Labs unremarkable including Trop. Awaiting confirmation of pregnancy before expected CTA.    [CS]  2308 Lab HCG is neg. Will send for CTA.    [CS]    Clinical Course User Index [CS] Truddie Hidden, MD   MDM Rules/Calculators/A&P                      Patient's EKG unremarkable. Labs normal. CXR clear. Her POC serum HCG was positive. Patient denies recent sexual activity and is on OCP. She needs a CTA to evaluate PE as the cause of her symptoms so I will send a lab HCG to confirm pregnancy status. Patient aware of plan.  Final Clinical Impression(s) / ED Diagnoses Final diagnoses:  None    Rx / DC Orders ED Discharge Orders    None       Truddie Hidden, MD 06/29/19 1041

## 2019-06-28 NOTE — ED Triage Notes (Signed)
Pt reports since middle of May had intermittent chest pains/palpitations. Reports that she got in with cardiologist and wore a heart monitor for 2 weeks. hasnt had her tests results yet. Has metoprolol and Nitro to take PRN. Reports today had back pains. Took Nitro 530p.

## 2019-06-29 ENCOUNTER — Encounter (HOSPITAL_COMMUNITY): Payer: Self-pay

## 2019-06-29 ENCOUNTER — Other Ambulatory Visit: Payer: Self-pay

## 2019-06-29 ENCOUNTER — Emergency Department (HOSPITAL_COMMUNITY): Payer: BC Managed Care – PPO

## 2019-06-29 MED ORDER — IOHEXOL 350 MG/ML SOLN
100.0000 mL | Freq: Once | INTRAVENOUS | Status: AC | PRN
  Administered 2019-06-29: 100 mL via INTRAVENOUS

## 2019-06-29 MED ORDER — SODIUM CHLORIDE (PF) 0.9 % IJ SOLN
INTRAMUSCULAR | Status: AC
  Filled 2019-06-29: qty 50

## 2019-06-29 NOTE — ED Provider Notes (Signed)
I assumed care of this patient.  Please see previous provider note for further details of Hx, PE.  Briefly patient is a 49 y.o. female who presented chest pain and palpitations. pending CTA to r/o PE.  CTA negative. Thymus mildly enlarged. Patient made aware.  The patient appears reasonably screened and/or stabilized for discharge and I doubt any other medical condition or other [facility] [employer] requiring further screening, evaluation, or treatment in the ED at this time prior to discharge. Safe for discharge with strict return precautions.  Disposition: Discharge  Condition: Good  I have discussed the results, Dx and Tx plan with the patient/family who expressed understanding and agree(s) with the plan. Discharge instructions discussed at length. The patient/family was given strict return precautions who verbalized understanding of the instructions. No further questions at time of discharge.    ED Discharge Orders    None       Follow Up: Nicholos Johns, MD [place] [id number] [phone]      .       Fatima Blank, MD 06/29/19 [phone]

## 2019-07-02 ENCOUNTER — Inpatient Hospital Stay: Admission: RE | Admit: 2019-07-02 | Payer: BC Managed Care – PPO | Source: Ambulatory Visit

## 2019-07-13 ENCOUNTER — Ambulatory Visit (INDEPENDENT_AMBULATORY_CARE_PROVIDER_SITE_OTHER)
Admission: RE | Admit: 2019-07-13 | Discharge: 2019-07-13 | Disposition: A | Discharge status: Home / Self Care | Payer: Self-pay | Source: Ambulatory Visit | Attending: Cardiology | Admitting: Cardiology

## 2019-07-13 ENCOUNTER — Other Ambulatory Visit: Payer: Self-pay

## 2019-07-13 DIAGNOSIS — R030 Elevated blood-pressure reading, without diagnosis of hypertension: Secondary | ICD-10-CM

## 2019-07-13 DIAGNOSIS — R011 Cardiac murmur, unspecified: Secondary | ICD-10-CM

## 2019-07-13 DIAGNOSIS — R002 Palpitations: Secondary | ICD-10-CM

## 2019-09-04 ENCOUNTER — Other Ambulatory Visit: Payer: Self-pay

## 2019-09-04 ENCOUNTER — Ambulatory Visit (INDEPENDENT_AMBULATORY_CARE_PROVIDER_SITE_OTHER): Payer: 59 | Admitting: Cardiology

## 2019-09-04 ENCOUNTER — Encounter: Payer: Self-pay | Admitting: Cardiology

## 2019-09-04 DIAGNOSIS — E782 Mixed hyperlipidemia: Secondary | ICD-10-CM

## 2019-09-04 HISTORY — DX: Mixed hyperlipidemia: E78.2

## 2019-09-04 NOTE — Progress Notes (Signed)
Cardiology Office Note:    Date:  09/04/2019   ID:  JOCILYNN Jimenez, DOB [birth year], MRN [id number]  PCP:  Nicholos Johns, MD  Cardiologist:  Jenean Lindau, MD   Referring MD: Nicholos Johns, MD    ASSESSMENT:    1. Mixed dyslipidemia    PLAN:    In order of problems listed above:  1. Primary prevention stressed with the patient.  Importance of compliance with diet medication stressed and she vocalized understanding. 2. Chest discomfort: I discussed findings with the patient at length and reassured her.  I told her to start an exercise program half an hour on the treadmill at least 5 days a week and she promises to do so. 3. Mixed dyslipidemia: She has mildly elevated LDL.  I reviewed previous records and diet was reemphasized.  She promises to follow.  She will be seen in follow-up appointment on a as needed basis.  Lipids will be followed by her primary care physician as will be her her other health issues. 4. Patient will be seen in follow-up appointment in 6 months or earlier if the patient has any concerns    Medication Adjustments/Labs and Tests Ordered: Current medicines are reviewed at length with the patient today.  Concerns regarding medicines are outlined above.  No orders of the defined types were placed in this encounter.  No orders of the defined types were placed in this encounter.    No chief complaint on file.    History of Present Illness:    Mallory Jimenez is a 49 y.o. female.  Patient has past medical history of mild dyslipidemia.  She has had chest pain symptoms and underwent CT coronary angiography which was unremarkable.  She had an exam and CT coronary angiogram with a calcium score of 0.  She is here for follow-up.  She occasionally has chest discomfort not related to exertion.  At the time of my evaluation, the patient is alert awake oriented and in no distress.  Past Medical History:  Diagnosis Date  . Abnormal pap    ASCUS as a teenager   .  Fibroid 01/05/2016   3 fibroids with one large pedunculated fibroid.  Ultrasound yearly.  . Fibroids   . Hyperlipidemia   . Irregular menses   . Lower back pain   . Migraine    without aura  . Vitamin D deficiency     Past Surgical History:  Procedure Laterality Date  . LAPAROSCOPIC GELPORT ASSISTED MYOMECTOMY N/A 10/29/2017   Procedure: LAPAROSCOPIC GELPORT ASSISTED MYOMECTOMY, EXCISION OF ENDOMETRIOSIS, LYSIS OF ADHESIONS;  Surgeon: Governor Specking, MD;  Location: [facility];  Service: Gynecology;  Laterality: N/A;  . WISDOM TOOTH EXTRACTION Bilateral age 71    Current Medications: Current Meds  Medication Sig  . acetaminophen (ACETAMINOPHEN 8 HOUR) 650 MG CR tablet Take 650 mg by mouth in the morning, at noon, in the evening, and at bedtime. Take 1-2 tablets QID PRN  . Ascorbic Acid (VITAMIN C) 500 MG CAPS Take 500 mg by mouth daily.  Marland Kitchen aspirin EC 81 MG tablet Take 81 mg by mouth once a week.   . Calcium Carb-Cholecalciferol (CALCIUM 600/VITAMIN D3) 600-800 MG-UNIT TABS Take 1 tablet by mouth daily.   . Cholecalciferol (VITAMIN D3) 2000 units capsule Take 2,000 Units by mouth daily.  Marland Kitchen estradiol (ESTRACE) 0.1 MG/GM vaginal cream Use 1 gm twice weekly (Patient taking differently: Place 1 Applicatorful vaginally daily as needed. )  . fluticasone (FLONASE) 50  MCG/ACT nasal spray Place 2 sprays into the nose daily as needed for allergies.   Marland Kitchen ibuprofen (ADVIL,MOTRIN) 200 MG tablet Take 400 mg by mouth every 8 (eight) hours as needed for mild pain.   Marland Kitchen levonorgestrel-ethinyl estradiol (KURVELO) 0.15-30 MG-MCG tablet Take 1 tablet by mouth daily.  . meclizine (ANTIVERT) 12.5 MG tablet Take 12.5 mg by mouth 2 (two) times daily. As needed for dizziness/vertigo.  . metoprolol tartrate (LOPRESSOR) 50 MG tablet Take 25 mg by mouth 2 (two) times daily.   . montelukast (SINGULAIR) 10 MG tablet Take 10 mg by mouth at bedtime.   . nitroGLYCERIN (NITROSTAT) 0.3 MG SL tablet Place  0.3 mg under the tongue every 5 (five) minutes as needed for chest pain.   . SUMAtriptan (IMITREX) 25 MG tablet Take 1 tablet by mouth every 2 (two) hours as needed for migraine.   . Vitamin D, Ergocalciferol, (DRISDOL) 50000 UNITS CAPS capsule Take 1 capsule by mouth once a week.     Allergies:   Patient has no known allergies.   Social History   Socioeconomic History  . Marital status: Single    Spouse name: Not on file  . Number of children: 0  . Years of education: Not on file  . Highest education level: Not on file  Occupational History    Employer: [employer]  Tobacco Use  . Smoking status: Never Smoker  . Smokeless tobacco: Never Used  Vaping Use  . Vaping Use: Never used  Substance and Sexual Activity  . Alcohol use: No  . Drug use: No  . Sexual activity: Not Currently    Partners: Male    Birth control/protection: Pill    Comment: Kurvelo  Other Topics Concern  . 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   Social Determinants of Health   Financial Resource Strain:   . Difficulty of Paying Living Expenses:   Food Insecurity:   . Worried About Charity fundraiser in the Last Year:   . Arboriculturist in the Last Year:   Transportation Needs:   . Film/video editor (Medical):   Marland Kitchen Lack of Transportation (Non-Medical):   Physical Activity:   . Days of Exercise per Week:   . Minutes of Exercise per Session:   Stress:   . Feeling of Stress :   Social Connections:   . Frequency of Communication with Friends and Family:   . Frequency of Social Gatherings with Friends and Family:   . Attends Religious Services:   . Active Member of Clubs or Organizations:   . Attends Archivist Meetings:   Marland Kitchen Marital Status:      Family History: The patient's family history includes Hypertension in her father, maternal grandmother, mother, and paternal grandmother; Lymphoma (age  of onset: 48) in her cousin; Other in her mother.  ROS:   Please see the history of present illness.    All other systems reviewed and are negative.  EKGs/Labs/Other Studies Reviewed:    The following studies were reviewed today: CLINICAL DATA:  Risk stratification  EXAM: Coronary Calcium Score  TECHNIQUE: The patient was scanned on a [employer] scanner. Axial non-contrast 3 mm slices were carried out through the heart. The data set was analyzed on a dedicated work station and scored using the [place].  FINDINGS: Non-cardiac: See separate report from [facility].  Ascending Aorta: Normal  caliber.  No calcifications.  Pericardium: Normal  Coronary arteries: Normal coronary origin of LM but cannot determine if RCA comes off left or right coronary cusp on this study.  IMPRESSION: Coronary calcium score of 0. This was 0 percentile for age and sex matched control.  Normal coronary origin of LM but cannot determine if RCA comes off left or right coronary cusp on this study. Consider cardiac MRI or coronary CTA if clinically indicated to rule out anomalous RCA.  Fransico Him   Electronically Signed   By: Fransico Him   On: 07/13/2019 17:02   Recent Labs: 06/28/2019: BUN 15; Creatinine, Ser 0.82; Hemoglobin 13.5; Platelets 259; Potassium 3.6; Sodium 140  Recent Lipid Panel    Component Value Date/Time   CHOL 168 05/27/2012 0907   TRIG 54 05/27/2012 0907   HDL 55 05/27/2012 0907   CHOLHDL 3.1 05/27/2012 0907   VLDL 11 05/27/2012 0907   LDLCALC 102 (H) 05/27/2012 0907    Physical Exam:    VS:  BP 122/72   Pulse 86   Ht 5\' 7"  (1.702 m)   Wt 171 lb 12.8 oz (77.9 kg)   SpO2 99%   BMI 26.91 kg/m     Wt Readings from Last 3 Encounters:  09/04/19 171 lb 12.8 oz (77.9 kg)  06/03/19 169 lb (76.7 kg)  10/15/18 162 lb 6.4 oz (73.7 kg)     GEN: Patient is in no acute distress HEENT: Normal NECK: No JVD; No carotid  bruits LYMPHATICS: No lymphadenopathy CARDIAC: Hear sounds regular, 2/6 systolic murmur at the apex. RESPIRATORY:  Clear to auscultation without rales, wheezing or rhonchi  ABDOMEN: Soft, non-tender, non-distended MUSCULOSKELETAL:  No edema; No deformity  SKIN: Warm and dry NEUROLOGIC:  Alert and oriented x 3 PSYCHIATRIC:  Normal affect   Signed, Jenean Lindau, MD  09/04/2019 4:10 PM    [facility]

## 2019-09-04 NOTE — Patient Instructions (Signed)

## 2019-10-08 ENCOUNTER — Other Ambulatory Visit: Payer: Self-pay | Admitting: Obstetrics and Gynecology

## 2019-10-08 DIAGNOSIS — Z01419 Encounter for gynecological examination (general) (routine) without abnormal findings: Secondary | ICD-10-CM

## 2019-10-08 NOTE — Telephone Encounter (Signed)
Medication refill request: Kurelo 28S Last AEX:  10/15/18 Next AEX: 10/19/19 Last MMG (if hormonal medication request): 03/12/17  Neg  Refill authorized: 84/0

## 2019-10-19 ENCOUNTER — Encounter: Payer: Self-pay | Admitting: Obstetrics and Gynecology

## 2019-10-19 ENCOUNTER — Ambulatory Visit: Payer: Self-pay | Admitting: Obstetrics and Gynecology

## 2019-10-19 ENCOUNTER — Telehealth: Payer: Self-pay

## 2019-10-19 NOTE — Progress Notes (Deleted)
49 y.o. G0P0000 Single African American female here for annual exam.    PCP:     No LMP recorded.           Sexually active: {yes no:314532}  The current method of family planning is {contraception:315051}.    Exercising: {yes no:314532}  {types:19826} Smoker:  no  Health Maintenance: Pap: 06-08-16 Neg:Neg HR HPV, 05-28-13 Neg:Neg HR HPV, 05-18-11 Neg History of abnormal Pap:*** Yes,ASCUS in her teens MMG:  ***03-12-17 Neg/density B/BiRads1 Colonoscopy:  n/a BMD:   n/a  Result  n/a TDaP:  ***01-30-07 Gardasil:   no HIV: 05-27-15 NR Hep C: *** Screening Labs:  Hb today: ***, Urine today: ***   reports that she has never smoked. She has never used smokeless tobacco. She reports that she does not drink alcohol and does not use drugs.  Past Medical History:  Diagnosis Date  . Abnormal pap    ASCUS as a teenager   . Fibroid 01/05/2016   3 fibroids with one large pedunculated fibroid.  Ultrasound yearly.  . Fibroids   . Hyperlipidemia   . Irregular menses   . Lower back pain   . Migraine    without aura  . Vitamin D deficiency     Past Surgical History:  Procedure Laterality Date  . LAPAROSCOPIC GELPORT ASSISTED MYOMECTOMY N/A 10/29/2017   Procedure: LAPAROSCOPIC GELPORT ASSISTED MYOMECTOMY, EXCISION OF ENDOMETRIOSIS, LYSIS OF ADHESIONS;  Surgeon: Governor Specking, MD;  [facility]: [facility];  Service: Gynecology;  Laterality: N/A;  . WISDOM TOOTH EXTRACTION Bilateral age 74    Current Outpatient Medications  Medication Sig Dispense Refill  . acetaminophen (ACETAMINOPHEN 8 HOUR) 650 MG CR tablet Take 650 mg by mouth in the morning, at noon, in the evening, and at bedtime. Take 1-2 tablets QID PRN    . Ascorbic Acid (VITAMIN C) 500 MG CAPS Take 500 mg by mouth daily.    Marland Kitchen aspirin EC 81 MG tablet Take 81 mg by mouth once a week.     . Calcium Carb-Cholecalciferol (CALCIUM 600/VITAMIN D3) 600-800 MG-UNIT TABS Take 1 tablet by mouth daily.     . Cholecalciferol  (VITAMIN D3) 2000 units capsule Take 2,000 Units by mouth daily.    Marland Kitchen estradiol (ESTRACE) 0.1 MG/GM vaginal cream Use 1 gm twice weekly (Patient taking differently: Place 1 Applicatorful vaginally daily as needed. ) 42.5 g 0  . fluticasone (FLONASE) 50 MCG/ACT nasal spray Place 2 sprays into the nose daily as needed for allergies.     Marland Kitchen ibuprofen (ADVIL,MOTRIN) 200 MG tablet Take 400 mg by mouth every 8 (eight) hours as needed for mild pain.     Marland Kitchen KURVELO 0.15-30 MG-MCG tablet TAKE 1 TABLET BY MOUTH DAILY 28 tablet 0  . meclizine (ANTIVERT) 12.5 MG tablet Take 12.5 mg by mouth 2 (two) times daily. As needed for dizziness/vertigo.    . metoprolol tartrate (LOPRESSOR) 50 MG tablet Take 25 mg by mouth 2 (two) times daily.     . montelukast (SINGULAIR) 10 MG tablet Take 10 mg by mouth at bedtime.     . nitroGLYCERIN (NITROSTAT) 0.3 MG SL tablet Place 0.3 mg under the tongue every 5 (five) minutes as needed for chest pain.     . SUMAtriptan (IMITREX) 25 MG tablet Take 1 tablet by mouth every 2 (two) hours as needed for migraine.   0  . Vitamin D, Ergocalciferol, (DRISDOL) 50000 UNITS CAPS capsule Take 1 capsule by mouth once a week.  No current facility-administered medications for this visit.    Family History  Problem Relation Age of Onset  . Hypertension Mother   . Other Mother        benign abdominal tumor x 2  . Hypertension Father   . Hypertension Maternal Grandmother   . Hypertension Paternal Grandmother   . Lymphoma Cousin 30       Non hogkins lymphoma    Review of Systems  Exam:   There were no vitals taken for this visit.    General appearance: alert, cooperative and appears stated age Head: normocephalic, without obvious abnormality, atraumatic Neck: no adenopathy, supple, symmetrical, trachea midline and thyroid normal to inspection and palpation Lungs: clear to auscultation bilaterally Breasts: normal appearance, no masses or tenderness, No nipple retraction or dimpling,  No nipple discharge or bleeding, No axillary adenopathy Heart: regular rate and rhythm Abdomen: soft, non-tender; no masses, no organomegaly Extremities: extremities normal, atraumatic, no cyanosis or edema Skin: skin color, texture, turgor normal. No rashes or lesions Lymph nodes: cervical, supraclavicular, and axillary nodes normal. Neurologic: grossly normal  Pelvic: External genitalia:  no lesions              No abnormal inguinal nodes palpated.              Urethra:  normal appearing urethra with no masses, tenderness or lesions              Bartholins and Skenes: normal                 Vagina: normal appearing vagina with normal color and discharge, no lesions              Cervix: no lesions              Pap taken: {yes no:314532} Bimanual Exam:  Uterus:  normal size, contour, position, consistency, mobility, non-tender              Adnexa: no mass, fullness, tenderness              Rectal exam: {yes no:314532}.  Confirms.              Anus:  normal sphincter tone, no lesions  Chaperone was present for exam.  Assessment:   Well woman visit with normal exam.   Plan: Mammogram screening discussed. Self breast awareness reviewed. Pap and HR HPV as above. Guidelines for Calcium, Vitamin D, regular exercise program including cardiovascular and weight bearing exercise.   Follow up annually and prn.   Additional counseling given.  {yes [id number]. _______ minutes face to face time of which over 50% was spent in counseling.    After visit summary provided.

## 2019-10-19 NOTE — Telephone Encounter (Signed)
Patient cancelled AEX for today due to work. Patient has been rescheduled.

## 2019-10-19 NOTE — Telephone Encounter (Signed)
Thank you :)

## 2019-11-01 ENCOUNTER — Other Ambulatory Visit: Payer: Self-pay | Admitting: Obstetrics and Gynecology

## 2019-11-01 DIAGNOSIS — Z01419 Encounter for gynecological examination (general) (routine) without abnormal findings: Secondary | ICD-10-CM

## 2019-11-02 NOTE — Telephone Encounter (Signed)
Medication refill request: Kurvelo 28S Last AEX:  10/08/19 Next AEX: 05/08/20 Last MMG (if hormonal medication request): 03/12/17  Neg  Refill authorized: 28/3

## 2019-11-09 ENCOUNTER — Other Ambulatory Visit: Payer: Self-pay

## 2019-11-09 DIAGNOSIS — Z01419 Encounter for gynecological examination (general) (routine) without abnormal findings: Secondary | ICD-10-CM

## 2019-11-09 MED ORDER — LEVONORGESTREL-ETHINYL ESTRAD 0.15-30 MG-MCG PO TABS: 1.0000 | ORAL_TABLET | Freq: Every day | ORAL | 1 refills | Status: DC

## 2019-11-09 NOTE — Telephone Encounter (Signed)
Patient notified of refill. Is aware will need to keep AEX for further refills.  Patient thankful for call.

## 2019-11-09 NOTE — Telephone Encounter (Signed)
Patient is calling to check status of refill for birth control.

## 2019-11-09 NOTE — Telephone Encounter (Signed)
Spoke with patient. Requesting refill of Kurvelo, she is out of medication.  Advised per review of Epic, last MMG on file 03/12/17 at [facility].  Patient states she thinks her last MMG was 09/10/18 at [facility]. Advised I will contact [facility] for updated MMG report. Advised will need update MMG for refills, recommended moving AEX to earlier date.   AEX r/s to 12/11/19 ar 9am with Dr. Quincy Simmonds. Patient declined earlier appts.  Advised after I received MMG report will review refill request with Dr. Quincy Simmonds and f/u, patient agreeable.   MMG report recived from [facility] 12/11/18 Negative, Bi-rads 1  Dr. Quincy Simmonds -ok to send refills of Kurvelo until next AEX?

## 2019-12-11 ENCOUNTER — Ambulatory Visit (INDEPENDENT_AMBULATORY_CARE_PROVIDER_SITE_OTHER): Payer: 59 | Admitting: Obstetrics and Gynecology

## 2019-12-11 ENCOUNTER — Other Ambulatory Visit: Payer: Self-pay

## 2019-12-11 ENCOUNTER — Encounter: Payer: Self-pay | Admitting: Obstetrics and Gynecology

## 2019-12-11 VITALS — BP 120/68 | HR 78 | Resp 14 | Ht 67.5 in | Wt 176.4 lb

## 2019-12-11 DIAGNOSIS — Z01419 Encounter for gynecological examination (general) (routine) without abnormal findings: Secondary | ICD-10-CM

## 2019-12-11 NOTE — Progress Notes (Signed)
49 y.o. G0P0000 Single African American female here for annual exam.    Menses are scant.  Last 2 -3 days.  Had episodes of heart racing.  Saw cardiology.  Did Holter monitor and calcium scoring.  Dx with random PACs. Using nitroglycerin and metoprolol for palpitations.  Had not used nitroglycerin.   She can still feel her heart racing.   Not using vaginal estrogen cream.   Working as a Interior and spatial designer.   Completed her Covid vaccine.  No booster yet.  Completed flu vaccine.   PCP:   Nicholos Johns, MD  Patient's last menstrual period was 12/02/2019.     Period Cycle (Days): 28 Period Duration (Days): 2-3 Period Pattern: Regular Menstrual Flow: Light Menstrual Control: Tampon, Maxi pad, Panty liner Menstrual Control Change Freq (Hours): changes pad/tampon once a day Dysmenorrhea: (!) Mild Dysmenorrhea Symptoms: Headache, Cramping     Sexually active: No.  The current method of family planning is OCP (estrogen/progesterone).    Exercising: No.  The patient does not participate in regular exercise at present. Smoker:  no  Health Maintenance: Pap:  06-08-16 negative, HR HPV negative           05-28-13 negative, HR HPV negative  History of abnormal Pap:  yes MMG: ?2020 at [facility] normal per patient, scheduled 12/21 at [facility] Colonoscopy:  n/a BMD:   n/a  Result  n/a TDaP:  PCP Gardasil:   no HIV: 06-06-15 negative  Hep C: no  Screening Labs:  Hb today: PCP, Urine today: PCP   reports that she has never smoked. She has never used smokeless tobacco. She reports that she does not drink alcohol and does not use drugs.  Past Medical History:  Diagnosis Date  . Abnormal pap    ASCUS as a teenager   . Fibroid 01/05/2016   3 fibroids with one large pedunculated fibroid.  Ultrasound yearly.  . Fibroids   . Hyperlipidemia   . Irregular menses   . Lower back pain   . Migraine    without aura  . Vitamin D deficiency     Past Surgical History:  Procedure  Laterality Date  . LAPAROSCOPIC GELPORT ASSISTED MYOMECTOMY N/A 10/29/2017   Procedure: LAPAROSCOPIC GELPORT ASSISTED MYOMECTOMY, EXCISION OF ENDOMETRIOSIS, LYSIS OF ADHESIONS;  Surgeon: Governor Specking, MD;  [facility]: [facility];  Service: Gynecology;  Laterality: N/A;  . WISDOM TOOTH EXTRACTION Bilateral age 27    Current Outpatient Medications  Medication Sig Dispense Refill  . acetaminophen (ACETAMINOPHEN 8 HOUR) 650 MG CR tablet Take 650 mg by mouth in the morning, at noon, in the evening, and at bedtime. Take 1-2 tablets QID PRN    . Ascorbic Acid (VITAMIN C) 500 MG CAPS Take 500 mg by mouth daily.    Marland Kitchen aspirin EC 81 MG tablet Take 81 mg by mouth once a week.     . Calcium Carb-Cholecalciferol (CALCIUM 600/VITAMIN D3) 600-800 MG-UNIT TABS Take 1 tablet by mouth daily.     . Cholecalciferol (VITAMIN D3) 2000 units capsule Take 2,000 Units by mouth daily.    Marland Kitchen estradiol (ESTRACE) 0.1 MG/GM vaginal cream Use 1 gm twice weekly (Patient taking differently: Place 1 Applicatorful vaginally as needed. ) 42.5 g 0  . fluticasone (FLONASE) 50 MCG/ACT nasal spray Place 2 sprays into the nose daily as needed for allergies.     Marland Kitchen ibuprofen (ADVIL,MOTRIN) 200 MG tablet Take 400 mg by mouth every 8 (eight) hours as needed for mild pain.     Marland Kitchen  levonorgestrel-ethinyl estradiol (KURVELO) 0.15-30 MG-MCG tablet Take 1 tablet by mouth daily. 28 tablet 1  . meclizine (ANTIVERT) 12.5 MG tablet Take 12.5 mg by mouth 2 (two) times daily. As needed for dizziness/vertigo.    . metoprolol tartrate (LOPRESSOR) 50 MG tablet Take 25 mg by mouth 2 (two) times daily.     . montelukast (SINGULAIR) 10 MG tablet Take 10 mg by mouth at bedtime.     . nitroGLYCERIN (NITROSTAT) 0.3 MG SL tablet Place 0.3 mg under the tongue every 5 (five) minutes as needed for chest pain.     . SUMAtriptan (IMITREX) 25 MG tablet Take 1 tablet by mouth every 2 (two) hours as needed for migraine.   0  . Vitamin D, Ergocalciferol,  (DRISDOL) 50000 UNITS CAPS capsule Take 1 capsule by mouth once a week.     No current facility-administered medications for this visit.    Family History  Problem Relation Age of Onset  . Hypertension Mother   . Other Mother        benign abdominal tumor x 2  . Hypertension Father   . Hypertension Maternal Grandmother   . Hypertension Paternal Grandmother   . Lymphoma Cousin 30       Non hogkins lymphoma    Review of Systems  All other systems reviewed and are negative.   Exam:   BP 120/68 (BP Location: Left Arm, Patient Position: Sitting, Cuff Size: Normal)   Pulse 78   Resp 14   Ht 5' 7.5" (1.715 m)   Wt 176 lb 6.4 oz (80 kg)   LMP 12/02/2019   BMI 27.22 kg/m     General appearance: alert, cooperative and appears stated age Head: normocephalic, without obvious abnormality, atraumatic Neck: no adenopathy, supple, symmetrical, trachea midline and thyroid normal to inspection and palpation Lungs: clear to auscultation bilaterally Breasts: normal appearance, no masses or tenderness, No nipple retraction or dimpling, No nipple discharge or bleeding, No axillary adenopathy Heart: regular rate and rhythm Abdomen: soft, non-tender; no masses, no organomegaly Extremities: extremities normal, atraumatic, no cyanosis or edema Skin: skin color, texture, turgor normal. No rashes or lesions Lymph nodes: cervical, supraclavicular, and axillary nodes normal. Neurologic: grossly normal  Pelvic: External genitalia:  no lesions              No abnormal inguinal nodes palpated.              Urethra:  normal appearing urethra with no masses, tenderness or lesions              Bartholins and Skenes: normal                 Vagina: normal appearing vagina with normal color and discharge, no lesions              Cervix: no lesions              Pap taken: No. Bimanual Exam:  Uterus:  normal size, contour, position, consistency, mobility, non-tender              Adnexa: no mass, fullness,  tenderness              Rectal exam: Yes.  .  Confirms.              Anus:  normal sphincter tone, no lesions  Chaperone was present for exam.  Assessment:   Well woman visit with normal exam. Status post laparoscopic myomectomy.  Hx migraine without aura.  Palpitations.   Plan: Mammogram screening discussed. Self breast awareness reviewed. Pap and HR HPV as above. Guidelines for Calcium, Vitamin D, regular exercise program including cardiovascular and weight bearing exercise. Will do a trial of stopping COCs. May consider POPs if would like to return to contraception and cycle control. Follow up annually and prn.   After visit summary provided.

## 2019-12-11 NOTE — Patient Instructions (Signed)

## 2019-12-31 ENCOUNTER — Other Ambulatory Visit: Payer: Self-pay | Admitting: Obstetrics and Gynecology

## 2019-12-31 DIAGNOSIS — Z01419 Encounter for gynecological examination (general) (routine) without abnormal findings: Secondary | ICD-10-CM

## 2020-01-13 ENCOUNTER — Ambulatory Visit: Payer: 59 | Admitting: Obstetrics and Gynecology

## 2020-01-23 DIAGNOSIS — M719 Bursopathy, unspecified: Secondary | ICD-10-CM

## 2020-01-23 HISTORY — DX: Bursopathy, unspecified: M71.9

## 2020-02-15 ENCOUNTER — Telehealth: Payer: Self-pay

## 2020-02-15 NOTE — Telephone Encounter (Signed)
vm left for pt to callback to see how she is doing and is she is having any palpitations.

## 2020-03-24 ENCOUNTER — Other Ambulatory Visit: Payer: Self-pay | Admitting: Chiropractor

## 2020-03-24 ENCOUNTER — Other Ambulatory Visit: Payer: Self-pay | Admitting: Internal Medicine

## 2020-03-24 DIAGNOSIS — R7989 Other specified abnormal findings of blood chemistry: Secondary | ICD-10-CM

## 2020-03-24 DIAGNOSIS — R945 Abnormal results of liver function studies: Secondary | ICD-10-CM

## 2020-04-18 ENCOUNTER — Other Ambulatory Visit: Payer: 59

## 2020-04-25 DIAGNOSIS — N926 Irregular menstruation, unspecified: Secondary | ICD-10-CM | POA: Insufficient documentation

## 2020-04-25 DIAGNOSIS — M545 Low back pain, unspecified: Secondary | ICD-10-CM | POA: Insufficient documentation

## 2020-04-25 DIAGNOSIS — G43909 Migraine, unspecified, not intractable, without status migrainosus: Secondary | ICD-10-CM | POA: Insufficient documentation

## 2020-04-25 DIAGNOSIS — E559 Vitamin D deficiency, unspecified: Secondary | ICD-10-CM | POA: Insufficient documentation

## 2020-04-25 DIAGNOSIS — D219 Benign neoplasm of connective and other soft tissue, unspecified: Secondary | ICD-10-CM | POA: Insufficient documentation

## 2020-04-25 DIAGNOSIS — E785 Hyperlipidemia, unspecified: Secondary | ICD-10-CM | POA: Insufficient documentation

## 2020-04-26 ENCOUNTER — Encounter: Payer: Self-pay | Admitting: Cardiology

## 2020-04-26 ENCOUNTER — Ambulatory Visit: Payer: 59 | Admitting: Cardiology

## 2020-04-26 ENCOUNTER — Other Ambulatory Visit: Payer: Self-pay

## 2020-04-26 VITALS — BP 128/70 | HR 76 | Ht 67.0 in | Wt 173.1 lb

## 2020-04-26 DIAGNOSIS — Z713 Dietary counseling and surveillance: Secondary | ICD-10-CM | POA: Insufficient documentation

## 2020-04-26 DIAGNOSIS — R002 Palpitations: Secondary | ICD-10-CM | POA: Diagnosis not present

## 2020-04-26 DIAGNOSIS — I209 Angina pectoris, unspecified: Secondary | ICD-10-CM

## 2020-04-26 DIAGNOSIS — E669 Obesity, unspecified: Secondary | ICD-10-CM | POA: Insufficient documentation

## 2020-04-26 DIAGNOSIS — I259 Chronic ischemic heart disease, unspecified: Secondary | ICD-10-CM | POA: Diagnosis not present

## 2020-04-26 DIAGNOSIS — E782 Mixed hyperlipidemia: Secondary | ICD-10-CM

## 2020-04-26 HISTORY — DX: Obesity, unspecified: E66.9

## 2020-04-26 HISTORY — DX: Dietary counseling and surveillance: Z71.3

## 2020-04-26 HISTORY — DX: Angina pectoris, unspecified: I20.9

## 2020-04-26 NOTE — Patient Instructions (Signed)
Medication Instructions:  No medication changes. *If you need a refill on your cardiac medications before your next appointment, please call your pharmacy*   Lab Work: None ordered If you have labs (blood work) drawn today and your tests are completely normal, you will receive your results only by: Marland Kitchen MyChart Message (if you have MyChart) OR . A paper copy in the mail If you have any lab test that is abnormal or we need to change your treatment, we will call you to review the results.   Testing/Procedures: Your cardiac CT will be scheduled at:   [facility] [place], [place] [address] [phone]   If scheduled at [facility], please arrive at the [facility] main entrance of [facility] 30 minutes prior to test start time. Proceed to the [facility] Radiology Department (first floor) to check-in and test prep.  Please follow these instructions carefully (unless otherwise directed):  On the Night Before the Test: . Be sure to Drink plenty of water. . Do not consume any caffeinated/decaffeinated beverages or chocolate 12 hours prior to your test. . Do not take any antihistamines 12 hours prior to your test.   On the Day of the Test: . Drink plenty of water. Do not drink any water within one hour of the test. . Do not eat any food 4 hours prior to the test. . You may take your regular medications prior to the test.  . Take metoprolol (Toprol) two hours prior to test. . FEMALES- please wear underwire-free bra if available     After the Test: . Drink plenty of water. . After receiving IV contrast, you may experience a mild flushed feeling. This is normal. . On occasion, you may experience a mild rash up to 24 hours after the test. This is not dangerous. If this occurs, you can take Benadryl 25 mg and increase your fluid intake. . If you experience trouble breathing, this can be serious. If it is severe call 911 IMMEDIATELY. If it is  mild, please call our office. . If you take any of these medications: Glipizide/Metformin, Avandament, Glucavance, please do not take 48 hours after completing test unless otherwise instructed.   Once we have confirmed authorization from your insurance company, we will call you to set up a date and time for your test. Based on how quickly your insurance processes prior authorizations requests, please allow up to 4 weeks to be contacted for scheduling your Cardiac CT appointment. Be advised that routine Cardiac CT appointments could be scheduled as many as 8 weeks after your provider has ordered it.  For non-scheduling related questions, please contact the cardiac imaging nurse navigator should you have any questions/concerns: Marchia Bond, Cardiac Imaging Nurse Navigator Burley Saver, Interim Cardiac Imaging Nurse [place] [facility] Direct Office Dial: [phone]   For scheduling needs, including cancellations and rescheduling, please call Vivien Rota at [phone].     Follow-Up: At [facility], you and your health needs are our priority.  As part of our continuing mission to provide you with exceptional heart care, we have created designated Provider Care Teams.  These Care Teams include your primary Cardiologist (physician) and Advanced Practice Providers (APPs -  Physician Assistants and Nurse Practitioners) who all work together to provide you with the care you need, when you need it.  We recommend signing up for the patient portal called "MyChart".  Sign up information is provided on this After Visit  Summary.  MyChart is used to connect with patients for Virtual Visits (Telemedicine).  Patients are able to view lab/test results, encounter notes, upcoming appointments, etc.  Non-urgent messages can be sent to your provider as well.   To learn more about what you can do with MyChart, go to [email or web address].    Your next appointment:   6 month(s)  The  format for your next appointment:   In Person  Provider:   Jyl Heinz, MD   Other Instructions Cardiac CT Angiogram A cardiac CT angiogram is a procedure to look at the heart and the area around the heart. It may be done to help find the cause of chest pains or other symptoms of heart disease. During this procedure, a substance called contrast dye is injected into the blood vessels in the area to be checked. A large X-ray machine, called a CT scanner, then takes detailed pictures of the heart and the surrounding area. The procedure is also sometimes called a coronary CT angiogram, coronary artery scanning, or CTA. A cardiac CT angiogram allows the health care provider to see how well blood is flowing to and from the heart. The health care provider will be able to see if there are any problems, such as:  Blockage or narrowing of the coronary arteries in the heart.  Fluid around the heart.  Signs of weakness or disease in the muscles, valves, and tissues of the heart. Tell a health care provider about:  Any allergies you have. This is especially important if you have had a previous allergic reaction to contrast dye.  All medicines you are taking, including vitamins, herbs, eye drops, creams, and over-the-counter medicines.  Any blood disorders you have.  Any surgeries you have had.  Any medical conditions you have.  Whether you are pregnant or may be pregnant.  Any anxiety disorders, chronic pain, or other conditions you have that may increase your stress or prevent you from lying still. What are the risks? Generally, this is a safe procedure. However, problems may occur, including: 1. Bleeding. 2. Infection. 3. Allergic reactions to medicines or dyes. 4. Damage to other structures or organs. 5. Kidney damage from the contrast dye that is used. 6. Increased risk of cancer from radiation exposure. This risk is low. Talk with your health care provider about: ? The risks and  benefits of testing. ? How you can receive the lowest dose of radiation. What happens before the procedure? 1. Wear comfortable clothing and remove any jewelry, glasses, dentures, and hearing aids. 2. Follow instructions from your health care provider about eating and drinking. This may include: ? For 12 hours before the procedure -- avoid caffeine. This includes tea, coffee, soda, energy drinks, and diet pills. Drink plenty of water or other fluids that do not have caffeine in them. Being well hydrated can prevent complications. ? For 4-6 hours before the procedure -- stop eating and drinking. The contrast dye can cause nausea, but this is less likely if your stomach is empty. 3. Ask your health care provider about changing or stopping your regular medicines. This is especially important if you are taking diabetes medicines, blood thinners, or medicines to treat problems with erections (erectile dysfunction). What happens during the procedure?  1. Hair on your chest may need to be removed so that small sticky patches called electrodes can be placed on your chest. These will transmit information that helps to monitor your heart during the procedure. 2. An IV will be inserted into  one of your veins. 3. You might be given a medicine to control your heart rate during the procedure. This will help to ensure that good images are obtained. 4. You will be asked to lie on an exam table. This table will slide in and out of the CT machine during the procedure. 5. Contrast dye will be injected into the IV. You might feel warm, or you may get a metallic taste in your mouth. 6. You will be given a medicine called nitroglycerin. This will relax or dilate the arteries in your heart. 7. The table that you are lying on will move into the CT machine tunnel for the scan. 8. The person running the machine will give you instructions while the scans are being done. You may be asked to: ? Keep your arms above your  head. ? Hold your breath. ? Stay very still, even if the table is moving. 9. When the scanning is complete, you will be moved out of the machine. 10. The IV will be removed. The procedure may vary among health care providers and hospitals. What can I expect after the procedure? After your procedure, it is common to have:  A metallic taste in your mouth from the contrast dye.  A feeling of warmth.  A headache from the nitroglycerin. Follow these instructions at home:  Take over-the-counter and prescription medicines only as told by your health care provider.  If you are told, drink enough fluid to keep your urine pale yellow. This will help to flush the contrast dye out of your body.  Most people can return to their normal activities right after the procedure. Ask your health care provider what activities are safe for you.  It is up to you to get the results of your procedure. Ask your health care provider, or the department that is doing the procedure, when your results will be ready.  Keep all follow-up visits as told by your health care provider. This is important. Contact a health care provider if: 1. You have any symptoms of allergy to the contrast dye. These include: ? Shortness of breath. ? Rash or hives. ? A racing heartbeat. Summary  A cardiac CT angiogram is a procedure to look at the heart and the area around the heart. It may be done to help find the cause of chest pains or other symptoms of heart disease.  During this procedure, a large X-ray machine, called a CT scanner, takes detailed pictures of the heart and the surrounding area after a contrast dye has been injected into blood vessels in the area.  Ask your health care provider about changing or stopping your regular medicines before the procedure. This is especially important if you are taking diabetes medicines, blood thinners, or medicines to treat erectile dysfunction.  If you are told, drink enough fluid to  keep your urine pale yellow. This will help to flush the contrast dye out of your body. This information is not intended to replace advice given to you by your health care provider. Make sure you discuss any questions you have with your health care provider. Document Revised: 09/03/2018 Document Reviewed: 09/03/2018 Elsevier Patient Education  [employer].

## 2020-04-26 NOTE — Progress Notes (Signed)
Cardiology Office Note:    Date:  04/26/2020   ID:  Mallory Jimenez, DOB [birth year], MRN [id number]  PCP:  Nicholos Johns, MD  Cardiologist:  Jenean Lindau, MD   Referring MD: Nicholos Johns, MD    ASSESSMENT:    1. Palpitations   2. Mixed dyslipidemia   3. Angina pectoris ([place])    PLAN:    In order of problems listed above:  1. Primary prevention stressed with the patient.  Importance of compliance with diet medication stressed and she vocalized understanding. 2. Angina pectoris: Patient symptoms are concerning.  She has had issues with chest pain recurrently in the past.  In view of this I will suggest a coronary CT angiography and she is agreeable.  Benefits and potential is explained.  Questions were answered to her satisfaction. 3. Palpitations: They have resolved for the most part.  I told her about the [employer] app and she will purchase one and keep documentation when she has symptoms.  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 4. Mixed dyslipidemia: Lipids were reviewed and diet was emphasized.  Once the above test is done I told her to diet and exercise and begin a graded exercise program for her fitness and overall wellbeing. 5. Patient will be seen in follow-up appointment in 6 months or earlier if the patient has any concerns    Medication Adjustments/Labs and Tests Ordered: Current medicines are reviewed at length with the patient today.  Concerns regarding medicines are outlined above.  No orders of the defined types were placed in this encounter.  No orders of the defined types were placed in this encounter.    No chief complaint on file.    History of Present Illness:    Mallory Jimenez is a 50 y.o. female.  Patient has past medical history of mixed dyslipidemia chest pain.  She mentions to me that overall she leads a sedentary lifestyle.  She is trying to adopt a different lifestyle.  She is concerned about chest tightness at  times.  This has been an ongoing issue for her in the past.  Her calcium score was low however her chest pain does concern.  It affects her quality of life.  At the time of my evaluation, the patient is alert awake oriented and in no distress.  No radiation to the neck or to the arms.  Past Medical History:  Diagnosis Date  . Cardiac murmur 06/03/2019  . Elevated blood pressure reading in office without diagnosis of hypertension 06/03/2019  . Fibroid 01/05/2016   3 fibroids with one large pedunculated fibroid.  Ultrasound yearly.  . Fibroid uterus 10/25/2016  . Fibroids   . Hyperlipidemia   . Irregular menses   . Lower back pain   . Migraine    without aura  . Mixed dyslipidemia 09/04/2019  . Palpitations 06/03/2019  . Seasonal allergies 06/03/2019  . Vitamin D deficiency     Past Surgical History:  Procedure Laterality Date  . LAPAROSCOPIC GELPORT ASSISTED MYOMECTOMY N/A 10/29/2017   Procedure: LAPAROSCOPIC GELPORT ASSISTED MYOMECTOMY, EXCISION OF ENDOMETRIOSIS, LYSIS OF ADHESIONS;  Surgeon: Governor Specking, MD;  Location: [facility];  Service: Gynecology;  Laterality: N/A;  . WISDOM TOOTH EXTRACTION Bilateral age 43    Current Medications: Current Meds  Medication Sig  . acetaminophen (TYLENOL) 650 MG CR tablet Take 650 mg by mouth in the morning, at noon, in the evening, and at bedtime. Take 1-2 tablets  QID PRN  . Ascorbic Acid (VITAMIN C) 500 MG CAPS Take 500 mg by mouth daily.  Marland Kitchen aspirin EC 81 MG tablet Take 81 mg by mouth once a week.   . Calcium Carb-Cholecalciferol 600-800 MG-UNIT TABS Take 1 tablet by mouth daily.   . fluticasone (FLONASE) 50 MCG/ACT nasal spray Place 2 sprays into the nose daily as needed for allergies.   Marland Kitchen ibuprofen (ADVIL,MOTRIN) 200 MG tablet Take 400 mg by mouth every 8 (eight) hours as needed for mild pain.   Marland Kitchen loratadine (CLARITIN) 10 MG tablet Take 10 mg by mouth daily.  . meclizine (ANTIVERT) 12.5 MG tablet Take 12.5 mg by mouth as  needed for dizziness.  . metoprolol tartrate (LOPRESSOR) 50 MG tablet Take 25 mg by mouth 2 (two) times daily.   . montelukast (SINGULAIR) 10 MG tablet Take 10 mg by mouth at bedtime.   . nitroGLYCERIN (NITROSTAT) 0.3 MG SL tablet Place 0.3 mg under the tongue every 5 (five) minutes as needed for chest pain.   . SUMAtriptan (IMITREX) 25 MG tablet Take 1 tablet by mouth every 2 (two) hours as needed for migraine.   . Vitamin D, Ergocalciferol, (DRISDOL) 50000 UNITS CAPS capsule Take 1 capsule by mouth once a week.     Allergies:   Patient has no known allergies.   Social History   Socioeconomic History  . Marital status: Single    Spouse name: Not on file  . Number of children: 0  . Years of education: Not on file  . Highest education level: Not on file  Occupational History    Employer: [employer]  Tobacco Use  . Smoking status: Never Smoker  . Smokeless tobacco: Never Used  Vaping Use  . Vaping Use: Never used  Substance and Sexual Activity  . Alcohol use: No  . Drug use: No  . Sexual activity: Not Currently    Partners: Male    Birth control/protection: Pill    Comment: [employer]  Other Topics Concern  . 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   Social Determinants of Health   Financial Resource Strain: Not on file  Food Insecurity: Not on file  Transportation Needs: Not on file  Physical Activity: Not on file  Stress: Not on file  Social Connections: Not on file     Family History: The patient's family history includes Hypertension in her father, maternal grandmother, mother, and paternal grandmother; Lymphoma (age of onset: 27) in her cousin; Other in her mother.  ROS:   Please see the history of present illness.    All other systems reviewed and are negative.  EKGs/Labs/Other Studies Reviewed:    The following studies were reviewed today: EKG reveals sinus rhythm and  nonspecific ST-T changes   Recent Labs: 06/28/2019: BUN 15; Creatinine, Ser 0.82; Hemoglobin 13.5; Platelets 259; Potassium 3.6; Sodium 140  Recent Lipid Panel    Component Value Date/Time   CHOL 168 05/27/2012 0907   TRIG 54 05/27/2012 0907   HDL 55 05/27/2012 0907   CHOLHDL 3.1 05/27/2012 0907   VLDL 11 05/27/2012 0907   LDLCALC 102 (H) 05/27/2012 0907    Physical Exam:    VS:  BP 128/70   Pulse 76   Ht 5\' 7"  (1.702 m)   Wt 173 lb 1.3 oz (78.5 kg)   SpO2 98%   BMI 27.11 kg/m     Wt Readings  from Last 3 Encounters:  04/26/20 173 lb 1.3 oz (78.5 kg)  12/11/19 176 lb 6.4 oz (80 kg)  09/04/19 171 lb 12.8 oz (77.9 kg)     GEN: Patient is in no acute distress HEENT: Normal NECK: No JVD; No carotid bruits LYMPHATICS: No lymphadenopathy CARDIAC: Hear sounds regular, 2/6 systolic murmur at the apex. RESPIRATORY:  Clear to auscultation without rales, wheezing or rhonchi  ABDOMEN: Soft, non-tender, non-distended MUSCULOSKELETAL:  No edema; No deformity  SKIN: Warm and dry NEUROLOGIC:  Alert and oriented x 3 PSYCHIATRIC:  Normal affect   Signed, Jenean Lindau, MD  04/26/2020 8:51 AM    [facility]

## 2020-04-28 ENCOUNTER — Ambulatory Visit: Payer: 59 | Admitting: Obstetrics and Gynecology

## 2020-05-02 DIAGNOSIS — L821 Other seborrheic keratosis: Secondary | ICD-10-CM | POA: Diagnosis not present

## 2020-05-02 DIAGNOSIS — D225 Melanocytic nevi of trunk: Secondary | ICD-10-CM | POA: Diagnosis not present

## 2020-05-02 DIAGNOSIS — L820 Inflamed seborrheic keratosis: Secondary | ICD-10-CM | POA: Diagnosis not present

## 2020-05-12 ENCOUNTER — Telehealth (HOSPITAL_COMMUNITY): Payer: Self-pay | Admitting: *Deleted

## 2020-05-12 NOTE — Telephone Encounter (Signed)
Reaching out to patient to offer assistance regarding upcoming cardiac imaging study; pt verbalizes understanding of appt date/time, parking situation and where to check in, pre-test NPO status and medications ordered, and verified current allergies; name and call back number provided for further questions should they arise  Lucielle Vokes RN Navigator Cardiac Imaging [facility] [phone] office [phone] cell  Pt to take 50mg metoprolol tartrate 2 hours prior to cardiac CT scan. 

## 2020-05-16 ENCOUNTER — Other Ambulatory Visit: Payer: Self-pay

## 2020-05-16 ENCOUNTER — Ambulatory Visit
Admission: RE | Admit: 2020-05-16 | Discharge: 2020-05-16 | Disposition: A | Discharge status: Home / Self Care | Payer: BC Managed Care – PPO | Source: Ambulatory Visit | Attending: Internal Medicine | Admitting: Internal Medicine

## 2020-05-16 ENCOUNTER — Ambulatory Visit (HOSPITAL_COMMUNITY)
Admission: RE | Admit: 2020-05-16 | Discharge: 2020-05-16 | Disposition: A | Discharge status: Home / Self Care | Payer: BC Managed Care – PPO | Source: Ambulatory Visit | Attending: Cardiology | Admitting: Cardiology

## 2020-05-16 DIAGNOSIS — I259 Chronic ischemic heart disease, unspecified: Secondary | ICD-10-CM | POA: Diagnosis not present

## 2020-05-16 DIAGNOSIS — Z006 Encounter for examination for normal comparison and control in clinical research program: Secondary | ICD-10-CM

## 2020-05-16 DIAGNOSIS — R002 Palpitations: Secondary | ICD-10-CM | POA: Diagnosis not present

## 2020-05-16 DIAGNOSIS — I209 Angina pectoris, unspecified: Secondary | ICD-10-CM | POA: Diagnosis not present

## 2020-05-16 DIAGNOSIS — R945 Abnormal results of liver function studies: Secondary | ICD-10-CM

## 2020-05-16 DIAGNOSIS — R7989 Other specified abnormal findings of blood chemistry: Secondary | ICD-10-CM

## 2020-05-16 MED ORDER — NITROGLYCERIN 0.4 MG SL SUBL
SUBLINGUAL_TABLET | SUBLINGUAL | Status: AC
  Administered 2020-05-16: 0.8 mg via SUBLINGUAL
  Filled 2020-05-16: qty 2

## 2020-05-16 MED ORDER — IOHEXOL 350 MG/ML SOLN
95.0000 mL | Freq: Once | INTRAVENOUS | Status: AC | PRN
  Administered 2020-05-16: 95 mL via INTRAVENOUS

## 2020-05-16 MED ORDER — NITROGLYCERIN 0.4 MG SL SUBL: 0.8000 mg | SUBLINGUAL_TABLET | Freq: Once | SUBLINGUAL | Status: AC

## 2020-05-16 NOTE — Research (Signed)
  IDENTIFY Informed Consent                  Subject Name: Mallory Jimenez    Subject met inclusion and exclusion criteria.  The informed consent form, study requirements and expectations were reviewed with the subject and questions and concerns were addressed prior to the signing of the consent form.  The subject verbalized understanding of the trial requirements.  The subject agreed to participate in the IDENTIFY trial and signed the informed consent at 15:59PM on 05/16/20.  The informed consent was obtained prior to performance of any protocol-specific procedures for the subject.  A copy of the signed informed consent was given to the subject and a copy was placed in the subject's medical record.   Meade Maw , Naval architect

## 2020-05-24 ENCOUNTER — Telehealth: Payer: Self-pay

## 2020-05-24 NOTE — Telephone Encounter (Signed)
Patient notified of test results 

## 2020-05-24 NOTE — Telephone Encounter (Signed)
-----   Message from Park Liter, MD sent at 05/20/2020  8:44 AM EDT ----- Coronary CT angio negative no evidence of coronary disease, no other pathology identified within the chest.

## 2020-05-30 DIAGNOSIS — M25512 Pain in left shoulder: Secondary | ICD-10-CM | POA: Diagnosis not present

## 2020-05-30 DIAGNOSIS — M47812 Spondylosis without myelopathy or radiculopathy, cervical region: Secondary | ICD-10-CM | POA: Diagnosis not present

## 2020-05-30 DIAGNOSIS — M542 Cervicalgia: Secondary | ICD-10-CM | POA: Diagnosis not present

## 2020-06-22 DIAGNOSIS — J069 Acute upper respiratory infection, unspecified: Secondary | ICD-10-CM | POA: Diagnosis not present

## 2020-07-11 DIAGNOSIS — M7542 Impingement syndrome of left shoulder: Secondary | ICD-10-CM | POA: Diagnosis not present

## 2020-08-09 DIAGNOSIS — M25512 Pain in left shoulder: Secondary | ICD-10-CM | POA: Diagnosis not present

## 2020-08-25 DIAGNOSIS — M7542 Impingement syndrome of left shoulder: Secondary | ICD-10-CM | POA: Insufficient documentation

## 2020-08-25 HISTORY — DX: Impingement syndrome of left shoulder: M75.42

## 2020-09-02 ENCOUNTER — Telehealth: Payer: Self-pay

## 2020-09-02 DIAGNOSIS — Z006 Encounter for examination for normal comparison and control in clinical research program: Secondary | ICD-10-CM

## 2020-09-02 NOTE — Telephone Encounter (Signed)
I have attempted without success to contact this patient by phone for her Identify 90 day follow up phone call. I left a message for patient to return my phone call with my name and callback number. An e-mail was also sent to patient.  

## 2020-09-13 DIAGNOSIS — M25512 Pain in left shoulder: Secondary | ICD-10-CM | POA: Diagnosis not present

## 2020-09-15 DIAGNOSIS — Z79899 Other long term (current) drug therapy: Secondary | ICD-10-CM | POA: Diagnosis not present

## 2020-09-15 DIAGNOSIS — R002 Palpitations: Secondary | ICD-10-CM | POA: Diagnosis not present

## 2020-09-15 DIAGNOSIS — I10 Essential (primary) hypertension: Secondary | ICD-10-CM | POA: Diagnosis not present

## 2020-09-15 DIAGNOSIS — E559 Vitamin D deficiency, unspecified: Secondary | ICD-10-CM | POA: Diagnosis not present

## 2020-09-15 DIAGNOSIS — R7303 Prediabetes: Secondary | ICD-10-CM | POA: Diagnosis not present

## 2020-09-15 DIAGNOSIS — E785 Hyperlipidemia, unspecified: Secondary | ICD-10-CM | POA: Diagnosis not present

## 2020-09-19 DIAGNOSIS — Z1159 Encounter for screening for other viral diseases: Secondary | ICD-10-CM | POA: Diagnosis not present

## 2020-09-19 DIAGNOSIS — Z20822 Contact with and (suspected) exposure to covid-19: Secondary | ICD-10-CM | POA: Diagnosis not present

## 2020-09-23 DIAGNOSIS — U071 COVID-19: Secondary | ICD-10-CM | POA: Diagnosis not present

## 2020-09-30 DIAGNOSIS — R300 Dysuria: Secondary | ICD-10-CM | POA: Diagnosis not present

## 2020-11-04 DIAGNOSIS — M25512 Pain in left shoulder: Secondary | ICD-10-CM | POA: Diagnosis not present

## 2020-12-12 ENCOUNTER — Ambulatory Visit: Payer: BC Managed Care – PPO | Admitting: Cardiology

## 2020-12-21 ENCOUNTER — Ambulatory Visit: Payer: Self-pay | Admitting: Obstetrics and Gynecology

## 2021-01-02 ENCOUNTER — Telehealth: Payer: Self-pay | Admitting: Obstetrics and Gynecology

## 2021-01-02 ENCOUNTER — Ambulatory Visit (INDEPENDENT_AMBULATORY_CARE_PROVIDER_SITE_OTHER): Payer: BC Managed Care – PPO | Admitting: Obstetrics and Gynecology

## 2021-01-02 ENCOUNTER — Other Ambulatory Visit (HOSPITAL_COMMUNITY)
Admission: RE | Admit: 2021-01-02 | Discharge: 2021-01-02 | Disposition: A | Discharge status: Home / Self Care | Payer: BC Managed Care – PPO | Source: Ambulatory Visit | Attending: Obstetrics and Gynecology | Admitting: Obstetrics and Gynecology

## 2021-01-02 ENCOUNTER — Other Ambulatory Visit: Payer: Self-pay

## 2021-01-02 ENCOUNTER — Encounter: Payer: Self-pay | Admitting: Obstetrics and Gynecology

## 2021-01-02 VITALS — BP 114/68 | HR 76 | Ht 67.0 in | Wt 170.0 lb

## 2021-01-02 DIAGNOSIS — Z01419 Encounter for gynecological examination (general) (routine) without abnormal findings: Secondary | ICD-10-CM

## 2021-01-02 DIAGNOSIS — Z1211 Encounter for screening for malignant neoplasm of colon: Secondary | ICD-10-CM | POA: Diagnosis not present

## 2021-01-02 DIAGNOSIS — Z23 Encounter for immunization: Secondary | ICD-10-CM | POA: Diagnosis not present

## 2021-01-02 DIAGNOSIS — N889 Noninflammatory disorder of cervix uteri, unspecified: Secondary | ICD-10-CM

## 2021-01-02 DIAGNOSIS — Z124 Encounter for screening for malignant neoplasm of cervix: Secondary | ICD-10-CM | POA: Insufficient documentation

## 2021-01-02 NOTE — Progress Notes (Signed)
50 y.o. G0P0000 Single African American female here for annual exam.    Since stopping birth control last year, menses are regularly irregular.  Menses occur generally monthly.   Notes her urine odor is stronger and dark in color.  No dysuria.  No hematuria.   Not sure if has vaginal odor.  No discharge.   More hot flashes.   Work is busy with a Comptroller.   PCP:   Nicholos Johns, MD  Patient's last menstrual period was 11/29/2020 (approximate).           Sexually active: No.  The current method of family planning is abstinence.    Exercising: No.  The patient does not participate in regular exercise at present.   Smoker:  no  Health Maintenance: Pap:  06-08-16 Neg:Neg HR HPV, 05-28-13 Neg:Neg HR HPV History of abnormal Pap:  no MMG:  12-11-18 Neg/BiRads1--patient thinks she had in 2021--she'll call and have faxed Colonoscopy:  n/a BMD:   n/a  Result  n/a TDaP:  PCP Gardasil:   no HIV: Neg in the past Hep C: Neg in the past Screening Labs:  PCP.  Flu vaccine:  today.  Covid booster:  x 1.    reports that she has never smoked. She has never used smokeless tobacco. She reports that she does not drink alcohol and does not use drugs.  Past Medical History:  Diagnosis Date   Angina pectoris ([place]) 04/26/2020   Bursitis 01/23/2020   left shoulder   Cardiac murmur 06/03/2019   Dietary counseling and surveillance 04/26/2020   Elevated blood pressure reading in office without diagnosis of hypertension 06/03/2019   Fibroid 01/05/2016   3 fibroids with one large pedunculated fibroid.  Ultrasound yearly.   Fibroid uterus 10/25/2016   Fibroids    Hyperlipidemia    Impingement syndrome of left shoulder region 08/25/2020   Irregular menses    Lower back pain    Migraine    without aura   Mixed dyslipidemia 09/04/2019   Obesity 04/26/2020   Palpitations 06/03/2019   Seasonal allergies 06/03/2019   Vitamin D deficiency     Past Surgical History:  Procedure Laterality  Date   LAPAROSCOPIC GELPORT ASSISTED MYOMECTOMY N/A 10/29/2017   Procedure: LAPAROSCOPIC GELPORT ASSISTED MYOMECTOMY, EXCISION OF ENDOMETRIOSIS, LYSIS OF ADHESIONS;  Surgeon: Governor Specking, MD;  Location: [facility];  Service: Gynecology;  Laterality: N/A;   WISDOM TOOTH EXTRACTION Bilateral age 57    Current Outpatient Medications  Medication Sig Dispense Refill   acetaminophen (TYLENOL) 650 MG CR tablet Take 650 mg by mouth in the morning, at noon, in the evening, and at bedtime. Take 1-2 tablets QID PRN     Ascorbic Acid (VITAMIN C) 500 MG CAPS Take 500 mg by mouth daily.     aspirin EC 81 MG tablet Take 81 mg by mouth once a week.      Calcium Carb-Cholecalciferol 600-800 MG-UNIT TABS Take 1 tablet by mouth daily.      fluticasone (FLONASE) 50 MCG/ACT nasal spray Place 2 sprays into the nose daily as needed for allergies.      ibuprofen (ADVIL,MOTRIN) 200 MG tablet Take 400 mg by mouth every 8 (eight) hours as needed for mild pain.      loratadine (CLARITIN) 10 MG tablet Take 10 mg by mouth daily.     meclizine (ANTIVERT) 12.5 MG tablet Take 12.5 mg by mouth as needed for dizziness.     metoprolol tartrate (LOPRESSOR) 50 MG tablet Take  25 mg by mouth 2 (two) times daily.      montelukast (SINGULAIR) 10 MG tablet Take 10 mg by mouth at bedtime.      nitroGLYCERIN (NITROSTAT) 0.3 MG SL tablet Place 0.3 mg under the tongue every 5 (five) minutes as needed for chest pain.      SUMAtriptan (IMITREX) 25 MG tablet Take 1 tablet by mouth every 2 (two) hours as needed for migraine.   0   Vitamin D, Ergocalciferol, (DRISDOL) 50000 UNITS CAPS capsule Take 1 capsule by mouth once a week.     No current facility-administered medications for this visit.    Family History  Problem Relation Age of Onset   Hypertension Mother    Other Mother        benign abdominal tumor x 2   Hypertension Father    Hypertension Maternal Grandmother    Hypertension Paternal Grandmother     Lymphoma Cousin 30       Non hogkins lymphoma    Review of Systems  All other systems reviewed and are negative.  Exam:   BP 114/68   Pulse 76   Ht 5\' 7"  (1.702 m)   Wt 170 lb (77.1 kg)   LMP 11/29/2020 (Approximate)   SpO2 98%   BMI 26.63 kg/m     General appearance: alert, cooperative and appears stated age Head: normocephalic, without obvious abnormality, atraumatic Neck: no adenopathy, supple, symmetrical, trachea midline and thyroid normal to inspection and palpation Lungs: clear to auscultation bilaterally Breasts: normal appearance, no masses or tenderness, No nipple retraction or dimpling, No nipple discharge or bleeding, No axillary adenopathy Heart: regular rate and rhythm Abdomen: soft, non-tender; no masses, no organomegaly Extremities: extremities normal, atraumatic, no cyanosis or edema Skin: skin color, texture, turgor normal. No rashes or lesions Lymph nodes: cervical, supraclavicular, and axillary nodes normal. Neurologic: grossly normal  Pelvic: External genitalia:  no lesions              No abnormal inguinal nodes palpated.              Urethra:  normal appearing urethra with no masses, tenderness or lesions              Bartholins and Skenes: normal                 Vagina: normal appearing vagina with normal color and discharge, no lesions              Cervix: no lesions.  Patchy redness of inferior cervix.               Pap taken: yes. Bimanual Exam:  Uterus:  normal size, contour, position, consistency, mobility, non-tender              Adnexa: no mass, fullness, tenderness              Rectal exam: yes.  Confirms.              Anus:  normal sphincter tone, no lesions  Chaperone was present for exam:  Lawson Radar., RN.  Assessment:   Well woman visit with gynecologic exam. Status post laparoscopic myomectomy.  Hx migraine without aura.  Cervical erythema.   Colon cancer screening.   Plan: Mammogram screening discussed.  Will get report from [facility], 2021.  She will schedule for 2022.  Pap and HR HPV Guidelines for Calcium, Vitamin D, regular exercise program including cardiovascular and weight bearing exercise. Flu vaccine.  Referral for colonoscopy with Dr. Collene Mares.  Labs with PCP. Increased hydration.  Follow up annually and prn.   After visit summary provided.

## 2021-01-02 NOTE — Telephone Encounter (Signed)
Please make referral for screening colonoscopy for colon cancer screening with Dr. Collene Mares.

## 2021-01-02 NOTE — Patient Instructions (Signed)

## 2021-01-03 NOTE — Telephone Encounter (Signed)
Office notes faxed to [phone] they will call patient to schedule. Patient aware confirmation received as well and number to office given to patient as well.

## 2021-01-04 ENCOUNTER — Encounter: Payer: Self-pay | Admitting: Obstetrics and Gynecology

## 2021-01-04 LAB — CYTOLOGY - PAP
Comment: NEGATIVE
Diagnosis: NEGATIVE
High risk HPV: NEGATIVE

## 2021-02-10 ENCOUNTER — Ambulatory Visit: Payer: BC Managed Care – PPO | Admitting: Cardiology

## 2021-02-21 DIAGNOSIS — Z1211 Encounter for screening for malignant neoplasm of colon: Secondary | ICD-10-CM | POA: Diagnosis not present

## 2021-02-21 DIAGNOSIS — M549 Dorsalgia, unspecified: Secondary | ICD-10-CM | POA: Diagnosis not present

## 2021-02-21 DIAGNOSIS — R000 Tachycardia, unspecified: Secondary | ICD-10-CM | POA: Diagnosis not present

## 2021-03-03 ENCOUNTER — Other Ambulatory Visit: Payer: Self-pay

## 2021-03-03 ENCOUNTER — Encounter: Payer: Self-pay | Admitting: Cardiology

## 2021-03-03 ENCOUNTER — Ambulatory Visit: Payer: BC Managed Care – PPO | Admitting: Cardiology

## 2021-03-03 VITALS — BP 120/64 | HR 86 | Ht 67.0 in | Wt 172.0 lb

## 2021-03-03 DIAGNOSIS — R002 Palpitations: Secondary | ICD-10-CM | POA: Diagnosis not present

## 2021-03-03 NOTE — Progress Notes (Signed)
Cardiology Office Note:    Date:  03/03/2021   ID:  Mallory Jimenez, DOB 02/02/1970, MRN [id number]  PCP:  Nicholos Johns, MD  Cardiologist:  Jenean Lindau, MD   Referring MD: Nicholos Johns, MD    ASSESSMENT:    1. Palpitations    PLAN:    In order of problems listed above:  Primary prevention stressed to the patient.  Importance of compliance with diet medications as she vocalized understanding.  She was advised to walk at least half an hour a day 5 days a week and she promises to do so. Palpitations: These have resolved for the most part.  She is on beta-blockers and tolerates it well.  She is happy about the fact that she does not have much in terms of palpitations.  I reviewed her records and reassured her about my findings. Patient will be seen in follow-up appointment in 12 months or earlier if the patient has any concerns    Medication Adjustments/Labs and Tests Ordered: Current medicines are reviewed at length with the patient today.  Concerns regarding medicines are outlined above.  No orders of the defined types were placed in this encounter.  No orders of the defined types were placed in this encounter.    No chief complaint on file.    History of Present Illness:    Mallory Jimenez is a 51 y.o. female.  Patient has past medical history of palpitations.  She denies any problems at this time.  She is a Designer, jewellery by profession.  She leads a sedentary lifestyle.  She does not exercise on a regular basis.  No chest pain orthopnea or PND.  She complains of occasional chest tightness not related to exertion.  Her calcium score was 0 recently.  At the time of my evaluation, the patient is alert awake oriented and in no distress.  Past Medical History:  Diagnosis Date   Angina pectoris ([place]) 04/26/2020   Bursitis 01/23/2020   left shoulder   Cardiac murmur 06/03/2019   Dietary counseling and surveillance 04/26/2020   Elevated blood pressure reading in  office without diagnosis of hypertension 06/03/2019   Fibroid 01/05/2016   3 fibroids with one large pedunculated fibroid.  Ultrasound yearly.   Fibroid uterus 10/25/2016   Fibroids    Hyperlipidemia    Impingement syndrome of left shoulder region 08/25/2020   Irregular menses    Lower back pain    Migraine    without aura   Mixed dyslipidemia 09/04/2019   Obesity 04/26/2020   Palpitations 06/03/2019   Seasonal allergies 06/03/2019   Vitamin D deficiency     Past Surgical History:  Procedure Laterality Date   LAPAROSCOPIC GELPORT ASSISTED MYOMECTOMY N/A 10/29/2017   Procedure: LAPAROSCOPIC GELPORT ASSISTED MYOMECTOMY, EXCISION OF ENDOMETRIOSIS, LYSIS OF ADHESIONS;  Surgeon: Governor Specking, MD;  Location: [facility];  Service: Gynecology;  Laterality: N/A;   WISDOM TOOTH EXTRACTION Bilateral age 61    Current Medications: Current Meds  Medication Sig   acetaminophen (TYLENOL) 650 MG CR tablet Take 650 mg by mouth in the morning, at noon, in the evening, and at bedtime. Take 1-2 tablets QID PRN   Ascorbic Acid (VITAMIN C) 500 MG CAPS Take 500 mg by mouth daily.   aspirin EC 81 MG tablet Take 81 mg by mouth once a week.    Calcium Carb-Cholecalciferol 600-800 MG-UNIT TABS Take 1 tablet by mouth daily.    fluticasone (FLONASE) 50 MCG/ACT nasal spray Place 2  sprays into the nose daily as needed for allergies.    ibuprofen (ADVIL,MOTRIN) 200 MG tablet Take 400 mg by mouth every 8 (eight) hours as needed for mild pain.    loratadine (CLARITIN) 10 MG tablet Take 10 mg by mouth daily.   meclizine (ANTIVERT) 12.5 MG tablet Take 12.5 mg by mouth as needed for dizziness.   metoprolol tartrate (LOPRESSOR) 50 MG tablet Take 25 mg by mouth 2 (two) times daily.    montelukast (SINGULAIR) 10 MG tablet Take 10 mg by mouth at bedtime.    nitroGLYCERIN (NITROSTAT) 0.3 MG SL tablet Place 0.3 mg under the tongue every 5 (five) minutes as needed for chest pain.    SUMAtriptan  (IMITREX) 25 MG tablet Take 1 tablet by mouth every 2 (two) hours as needed for migraine.    Vitamin D, Ergocalciferol, (DRISDOL) 50000 UNITS CAPS capsule Take 1 capsule by mouth once a week.     Allergies:   Patient has no known allergies.   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   Social Determinants of Health   Financial Resource Strain: Not on file  Food Insecurity: Not on file  Transportation Needs: Not on file  Physical Activity: Not on file  Stress: Not on file  Social Connections: Not on file     Family History: The patient's family history includes Hypertension in her father, maternal grandmother, mother, and paternal grandmother; Lymphoma (age of onset: 66) in her cousin; Other in her mother.  ROS:   Please see the history of present illness.    All other systems reviewed and are negative.  EKGs/Labs/Other Studies Reviewed:    The following studies were reviewed today: I discussed my findings with the patient at length.  EKG has revealed sinus rhythm and nonspecific ST-T changes   Recent Labs: No results found for requested labs within last 8760 hours.  Recent Lipid Panel    Component Value Date/Time   CHOL 168 05/27/2012 0907   TRIG 54 05/27/2012 0907   HDL 55 05/27/2012 0907   CHOLHDL 3.1 05/27/2012 0907   VLDL 11 05/27/2012 0907   LDLCALC 102 (H) 05/27/2012 0907    Physical Exam:     VS:  BP 120/64    Pulse 86    Ht 5\' 7"  (1.702 m)    Wt 172 lb (78 kg)    LMP  (LMP Unknown)    SpO2 98%    BMI 26.94 kg/m     Wt Readings from Last 3 Encounters:  03/03/21 172 lb (78 kg)  01/02/21 170 lb (77.1 kg)  04/26/20 173 lb 1.3 oz (78.5 kg)     GEN: Patient is in no acute distress HEENT: Normal NECK: No JVD; No carotid bruits LYMPHATICS: No lymphadenopathy CARDIAC: Hear sounds regular, 2/6 systolic murmur  at the apex. RESPIRATORY:  Clear to auscultation without rales, wheezing or rhonchi  ABDOMEN: Soft, non-tender, non-distended MUSCULOSKELETAL:  No edema; No deformity  SKIN: Warm and dry NEUROLOGIC:  Alert and oriented x 3 PSYCHIATRIC:  Normal affect   Signed, Jenean Lindau, MD  03/03/2021 4:43 PM    [facility]

## 2021-03-03 NOTE — Patient Instructions (Signed)

## 2021-05-12 DIAGNOSIS — K635 Polyp of colon: Secondary | ICD-10-CM | POA: Diagnosis not present

## 2021-05-12 DIAGNOSIS — K5730 Diverticulosis of large intestine without perforation or abscess without bleeding: Secondary | ICD-10-CM | POA: Diagnosis not present

## 2021-05-12 DIAGNOSIS — K6389 Other specified diseases of intestine: Secondary | ICD-10-CM | POA: Diagnosis not present

## 2021-05-12 DIAGNOSIS — D122 Benign neoplasm of ascending colon: Secondary | ICD-10-CM | POA: Diagnosis not present

## 2021-05-12 DIAGNOSIS — Z1211 Encounter for screening for malignant neoplasm of colon: Secondary | ICD-10-CM | POA: Diagnosis not present

## 2021-05-16 ENCOUNTER — Ambulatory Visit: Payer: Self-pay | Admitting: Cardiology

## 2021-06-20 DIAGNOSIS — E559 Vitamin D deficiency, unspecified: Secondary | ICD-10-CM | POA: Diagnosis not present

## 2021-06-20 DIAGNOSIS — E538 Deficiency of other specified B group vitamins: Secondary | ICD-10-CM | POA: Diagnosis not present

## 2021-06-20 DIAGNOSIS — Z79899 Other long term (current) drug therapy: Secondary | ICD-10-CM | POA: Diagnosis not present

## 2021-06-20 DIAGNOSIS — Z6827 Body mass index (BMI) 27.0-27.9, adult: Secondary | ICD-10-CM | POA: Diagnosis not present

## 2021-06-20 DIAGNOSIS — Z0000 Encounter for general adult medical examination without abnormal findings: Secondary | ICD-10-CM | POA: Diagnosis not present

## 2021-06-20 DIAGNOSIS — N951 Menopausal and female climacteric states: Secondary | ICD-10-CM | POA: Diagnosis not present

## 2021-12-26 NOTE — Progress Notes (Deleted)
52 y.o. G0P0000 Single African American female here for annual exam.    PCP:     No LMP recorded.           Sexually active: {yes no:314532}  The current method of family planning is {contraception:315051}.    Exercising: {yes no:314532}  {types:19826} Smoker:  {YES NO:22349}  Health Maintenance: Pap:  01/02/21 negative: negative HR HPV, 06/08/16 negative: negative HR HPV History of abnormal Pap:  no MMG:  12/31/19, Breast Density Category C, BI-RADS CATEGORY 1 Negative Colonoscopy:  n/a BMD:   n/a  Result  n/a TDaP:  PCP Gardasil:   no HIV: neg in the past Hep C: neg in the past Screening Labs:  Hb today: ***, Urine today: ***   reports that she has never smoked. She has never used smokeless tobacco. She reports that she does not drink alcohol and does not use drugs.  Past Medical History:  Diagnosis Date   Angina pectoris ([place]) 04/26/2020   Bursitis 01/23/2020   left shoulder   Cardiac murmur 06/03/2019   Dietary counseling and surveillance 04/26/2020   Elevated blood pressure reading in office without diagnosis of hypertension 06/03/2019   Fibroid 01/05/2016   3 fibroids with one large pedunculated fibroid.  Ultrasound yearly.   Fibroid uterus 10/25/2016   Fibroids    Hyperlipidemia    Impingement syndrome of left shoulder region 08/25/2020   Irregular menses    Lower back pain    Migraine    without aura   Mixed dyslipidemia 09/04/2019   Obesity 04/26/2020   Palpitations 06/03/2019   Seasonal allergies 06/03/2019   Vitamin D deficiency     Past Surgical History:  Procedure Laterality Date   LAPAROSCOPIC GELPORT ASSISTED MYOMECTOMY N/A 10/29/2017   Procedure: LAPAROSCOPIC GELPORT ASSISTED MYOMECTOMY, EXCISION OF ENDOMETRIOSIS, LYSIS OF ADHESIONS;  Surgeon: Governor Specking, MD;  Location: [facility];  Service: Gynecology;  Laterality: N/A;   WISDOM TOOTH EXTRACTION Bilateral age 62    Current Outpatient Medications  Medication Sig Dispense  Refill   acetaminophen (TYLENOL) 650 MG CR tablet Take 650 mg by mouth in the morning, at noon, in the evening, and at bedtime. Take 1-2 tablets QID PRN     Ascorbic Acid (VITAMIN C) 500 MG CAPS Take 500 mg by mouth daily.     aspirin EC 81 MG tablet Take 81 mg by mouth once a week.      Calcium Carb-Cholecalciferol 600-800 MG-UNIT TABS Take 1 tablet by mouth daily.      fluticasone (FLONASE) 50 MCG/ACT nasal spray Place 2 sprays into the nose daily as needed for allergies.      ibuprofen (ADVIL,MOTRIN) 200 MG tablet Take 400 mg by mouth every 8 (eight) hours as needed for mild pain.      loratadine (CLARITIN) 10 MG tablet Take 10 mg by mouth daily.     meclizine (ANTIVERT) 12.5 MG tablet Take 12.5 mg by mouth as needed for dizziness.     metoprolol tartrate (LOPRESSOR) 50 MG tablet Take 25 mg by mouth 2 (two) times daily.      montelukast (SINGULAIR) 10 MG tablet Take 10 mg by mouth at bedtime.      nitroGLYCERIN (NITROSTAT) 0.3 MG SL tablet Place 0.3 mg under the tongue every 5 (five) minutes as needed for chest pain.      SUMAtriptan (IMITREX) 25 MG tablet Take 1 tablet by mouth every 2 (two) hours as needed for migraine.   0   Vitamin D,  Ergocalciferol, (DRISDOL) 50000 UNITS CAPS capsule Take 1 capsule by mouth once a week.     No current facility-administered medications for this visit.    Family History  Problem Relation Age of Onset   Hypertension Mother    Other Mother        benign abdominal tumor x 2   Hypertension Father    Hypertension Maternal Grandmother    Hypertension Paternal Grandmother    Lymphoma Cousin 30       Non hogkins lymphoma    Review of Systems  Exam:   There were no vitals taken for this visit.    General appearance: alert, cooperative and appears stated age Head: normocephalic, without obvious abnormality, atraumatic Neck: no adenopathy, supple, symmetrical, trachea midline and thyroid normal to inspection and palpation Lungs: clear to auscultation  bilaterally Breasts: normal appearance, no masses or tenderness, No nipple retraction or dimpling, No nipple discharge or bleeding, No axillary adenopathy Heart: regular rate and rhythm Abdomen: soft, non-tender; no masses, no organomegaly Extremities: extremities normal, atraumatic, no cyanosis or edema Skin: skin color, texture, turgor normal. No rashes or lesions Lymph nodes: cervical, supraclavicular, and axillary nodes normal. Neurologic: grossly normal  Pelvic: External genitalia:  no lesions              No abnormal inguinal nodes palpated.              Urethra:  normal appearing urethra with no masses, tenderness or lesions              Bartholins and Skenes: normal                 Vagina: normal appearing vagina with normal color and discharge, no lesions              Cervix: no lesions              Pap taken: {yes no:314532} Bimanual Exam:  Uterus:  normal size, contour, position, consistency, mobility, non-tender              Adnexa: no mass, fullness, tenderness              Rectal exam: {yes no:314532}.  Confirms.              Anus:  normal sphincter tone, no lesions  Chaperone was present for exam:  ***  Assessment:   Well woman visit with gynecologic exam.   Plan: Mammogram screening discussed. Self breast awareness reviewed. Pap and HR HPV as above. Guidelines for Calcium, Vitamin D, regular exercise program including cardiovascular and weight bearing exercise.   Follow up annually and prn.   Additional counseling given.  {yes [id number]. _______ minutes face to face time of which over 50% was spent in counseling.    After visit summary provided.

## 2022-01-03 ENCOUNTER — Ambulatory Visit: Payer: BC Managed Care – PPO | Admitting: Obstetrics and Gynecology

## 2022-01-31 NOTE — Progress Notes (Unsigned)
52 y.o. G0P0000 Single African American female here for annual exam.    Losing weight.   Has some brain fog.  Menses are regular but lighter.  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  PCP:   Nicholos Johns, MD  Patient's last menstrual period was 01/30/2022.     Period Cycle (Days): 28 Period Duration (Days): 4-5 Period Pattern: Regular Menstrual Flow: Moderate Menstrual Control: Tampon, Maxi pad Dysmenorrhea: (!) Mild     Sexually active: No.  The current method of family planning is abstinence.    Exercising: No.     Smoker:  no  Health Maintenance: Pap:  01/02/21 neg: HR HPV neg,  06-08-16 Neg:Neg HR HPV, 05-28-13 Neg:Neg HR HPV  History of abnormal Pap:  no MMG:  12/31/19 - Breast Density Category C, BI-RADS CATEGORY 1 Neg Colonoscopy:  05/12/21 - due 7 years.  BMD:   n/a  Result  n/a TDaP:  01/30/2007? Gardasil:   no HIV: neg in the past Hep C: neg in the past Screening Labs:  PCP Flu vaccine:  completed.    reports that she has never smoked. She has never used smokeless tobacco. She reports that she does not drink alcohol and does not use drugs.  Past Medical History:  Diagnosis Date   Angina pectoris ([place]) 04/26/2020   Bursitis 01/23/2020   left shoulder   Cardiac murmur 06/03/2019   Dietary counseling and surveillance 04/26/2020   Elevated blood pressure reading in office without diagnosis of hypertension 06/03/2019   Fibroid 01/05/2016   3 fibroids with one large pedunculated fibroid.  Ultrasound yearly.   Fibroid uterus 10/25/2016   Fibroids    Hyperlipidemia    Impingement syndrome of left shoulder region 08/25/2020   Irregular menses    Lower back pain    Migraine    without aura   Mixed dyslipidemia 09/04/2019   Obesity 04/26/2020   Palpitations 06/03/2019   Seasonal allergies 06/03/2019   Vitamin D deficiency     Past Surgical History:  Procedure Laterality  Date   LAPAROSCOPIC GELPORT ASSISTED MYOMECTOMY N/A 10/29/2017   Procedure: LAPAROSCOPIC GELPORT ASSISTED MYOMECTOMY, EXCISION OF ENDOMETRIOSIS, LYSIS OF ADHESIONS;  Surgeon: Governor Specking, MD;  Location: [facility];  Service: Gynecology;  Laterality: N/A;   WISDOM TOOTH EXTRACTION Bilateral age 54    Current Outpatient Medications  Medication Sig Dispense Refill   acetaminophen (TYLENOL) 650 MG CR tablet Take 650 mg by mouth in the morning, at noon, in the evening, and at bedtime. Take 1-2 tablets QID PRN     Ascorbic Acid (VITAMIN C) 500 MG CAPS Take 500 mg by mouth daily.     aspirin EC 81 MG tablet Take 81 mg by mouth once a week.      Calcium Carb-Cholecalciferol 600-800 MG-UNIT TABS Take 1 tablet by mouth daily.      fluticasone (FLONASE) 50 MCG/ACT nasal spray Place 2 sprays into the nose daily as needed for allergies.      ibuprofen (ADVIL,MOTRIN) 200 MG tablet Take 400 mg by mouth every 8 (eight) hours as needed for mild pain.      loratadine (CLARITIN) 10 MG tablet Take 10 mg by mouth daily.     meclizine (ANTIVERT) 12.5 MG tablet Take 12.5 mg by mouth as needed for dizziness.     metoprolol tartrate (LOPRESSOR) 50 MG tablet Take 25 mg by mouth 2 (two) times daily.  montelukast (SINGULAIR) 10 MG tablet Take 10 mg by mouth at bedtime.      nitroGLYCERIN (NITROSTAT) 0.3 MG SL tablet Place 0.3 mg under the tongue every 5 (five) minutes as needed for chest pain.      SUMAtriptan (IMITREX) 25 MG tablet Take 1 tablet by mouth every 2 (two) hours as needed for migraine.   0   Vitamin D, Ergocalciferol, (DRISDOL) 50000 UNITS CAPS capsule Take 1 capsule by mouth once a week.     No current facility-administered medications for this visit.    Family History  Problem Relation Age of Onset   Hypertension Mother    Other Mother        benign abdominal tumor x 2   Hypertension Father    Hypertension Maternal Grandmother    Hypertension Paternal Grandmother     Lymphoma Cousin 30       Non hogkins lymphoma    Review of Systems  All other systems reviewed and are negative.   Exam:   BP 124/82 (BP Location: Right Arm, Patient Position: Sitting, Cuff Size: Normal)   Ht '5\' 8"'$  (1.727 m)   Wt 171 lb (77.6 kg)   LMP 01/30/2022   BMI 26.00 kg/m     General appearance: alert, cooperative and appears stated age Head: normocephalic, without obvious abnormality, atraumatic Neck: no adenopathy, supple, symmetrical, trachea midline and thyroid normal to inspection and palpation Lungs: clear to auscultation bilaterally Breasts: normal appearance, no masses or tenderness, No nipple retraction or dimpling, No nipple discharge or bleeding, No axillary adenopathy Heart: regular rate and rhythm Abdomen: soft, non-tender; no masses, no organomegaly Extremities: extremities normal, atraumatic, no cyanosis or edema Skin: skin color, texture, turgor normal. No rashes or lesions Lymph nodes: cervical, supraclavicular, and axillary nodes normal. Neurologic: grossly normal  Pelvic: External genitalia:  no lesions              No abnormal inguinal nodes palpated.              Urethra:  normal appearing urethra with no masses, tenderness or lesions              Bartholins and Skenes: normal                 Vagina: normal appearing vagina with normal color and discharge, no lesions              Cervix: no lesions              Pap taken: no Bimanual Exam:  Uterus:  normal size, contour, position, consistency, mobility, non-tender              Adnexa: no mass, fullness, tenderness              Rectal exam: yes.  Confirms.              Anus:  normal sphincter tone, no lesions  Chaperone was present for exam:  Emily  Assessment:   Well woman visit with gynecologic exam. Status post laparoscopic myomectomy.  Hx migraine without aura.  Brain fog.   Plan: Mammogram screening discussed.  She will update. Self breast awareness reviewed. Pap and HR HPV as  above. Guidelines for Calcium, Vitamin D, regular exercise program including cardiovascular and weight bearing exercise. I encouraged balance in her life with work and nonwork activities.   Follow up annually and prn.   After visit summary provided.

## 2022-02-13 ENCOUNTER — Ambulatory Visit: Payer: PRIVATE HEALTH INSURANCE | Admitting: Obstetrics and Gynecology

## 2022-02-13 ENCOUNTER — Encounter: Payer: Self-pay | Admitting: Obstetrics and Gynecology

## 2022-02-13 VITALS — BP 124/82 | Ht 68.0 in | Wt 171.0 lb

## 2022-02-13 DIAGNOSIS — Z01419 Encounter for gynecological examination (general) (routine) without abnormal findings: Secondary | ICD-10-CM

## 2022-02-13 NOTE — Patient Instructions (Signed)

## 2022-05-12 IMAGING — CR DG CHEST 2V
2 series · 2 of 2 positions shown · non-contrast
Comparison: Radiograph 03/03/2012

CLINICAL DATA: Chest pain and palpitations, intermittent

EXAM:
CHEST - 2 VIEW

[w chest pa]
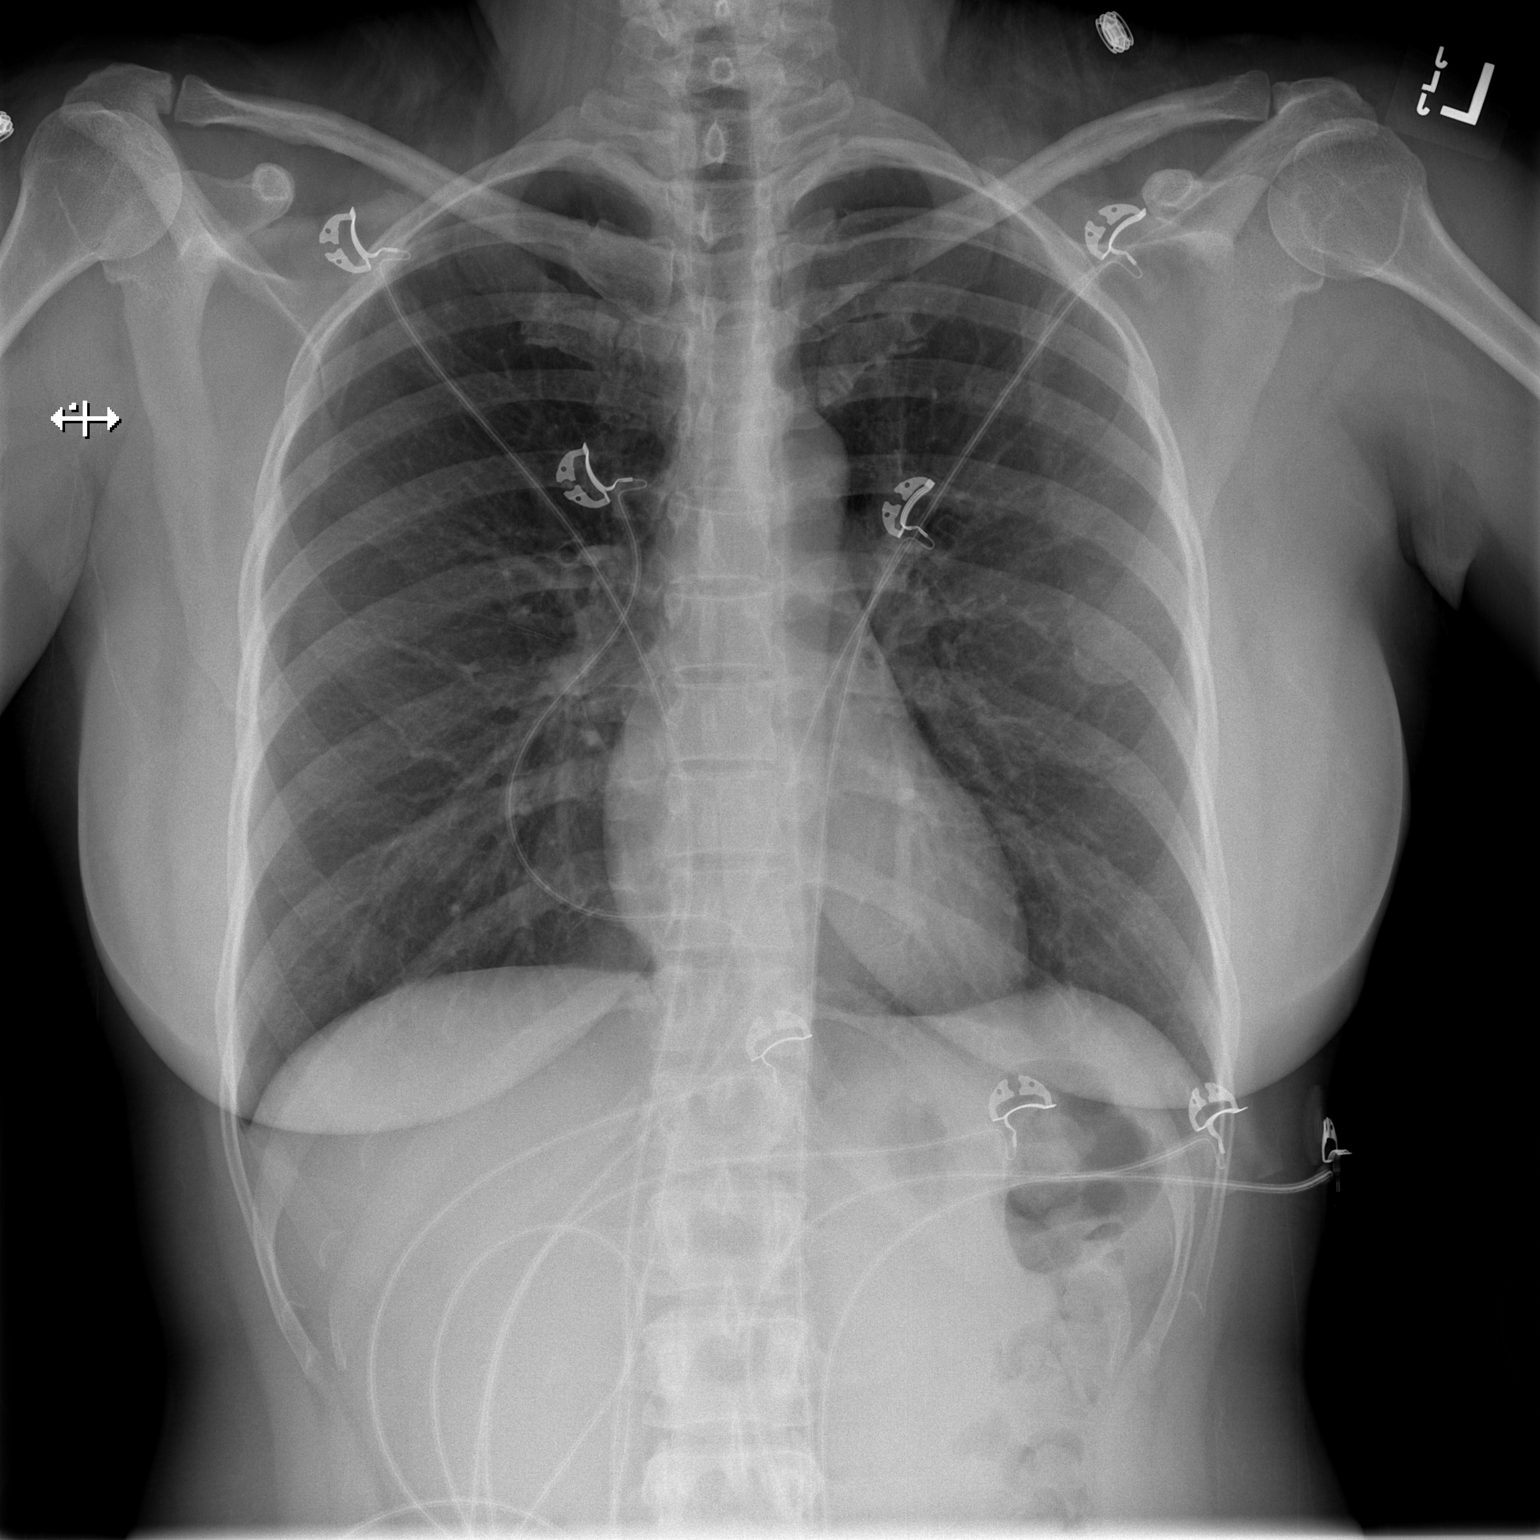

[w chest lat]
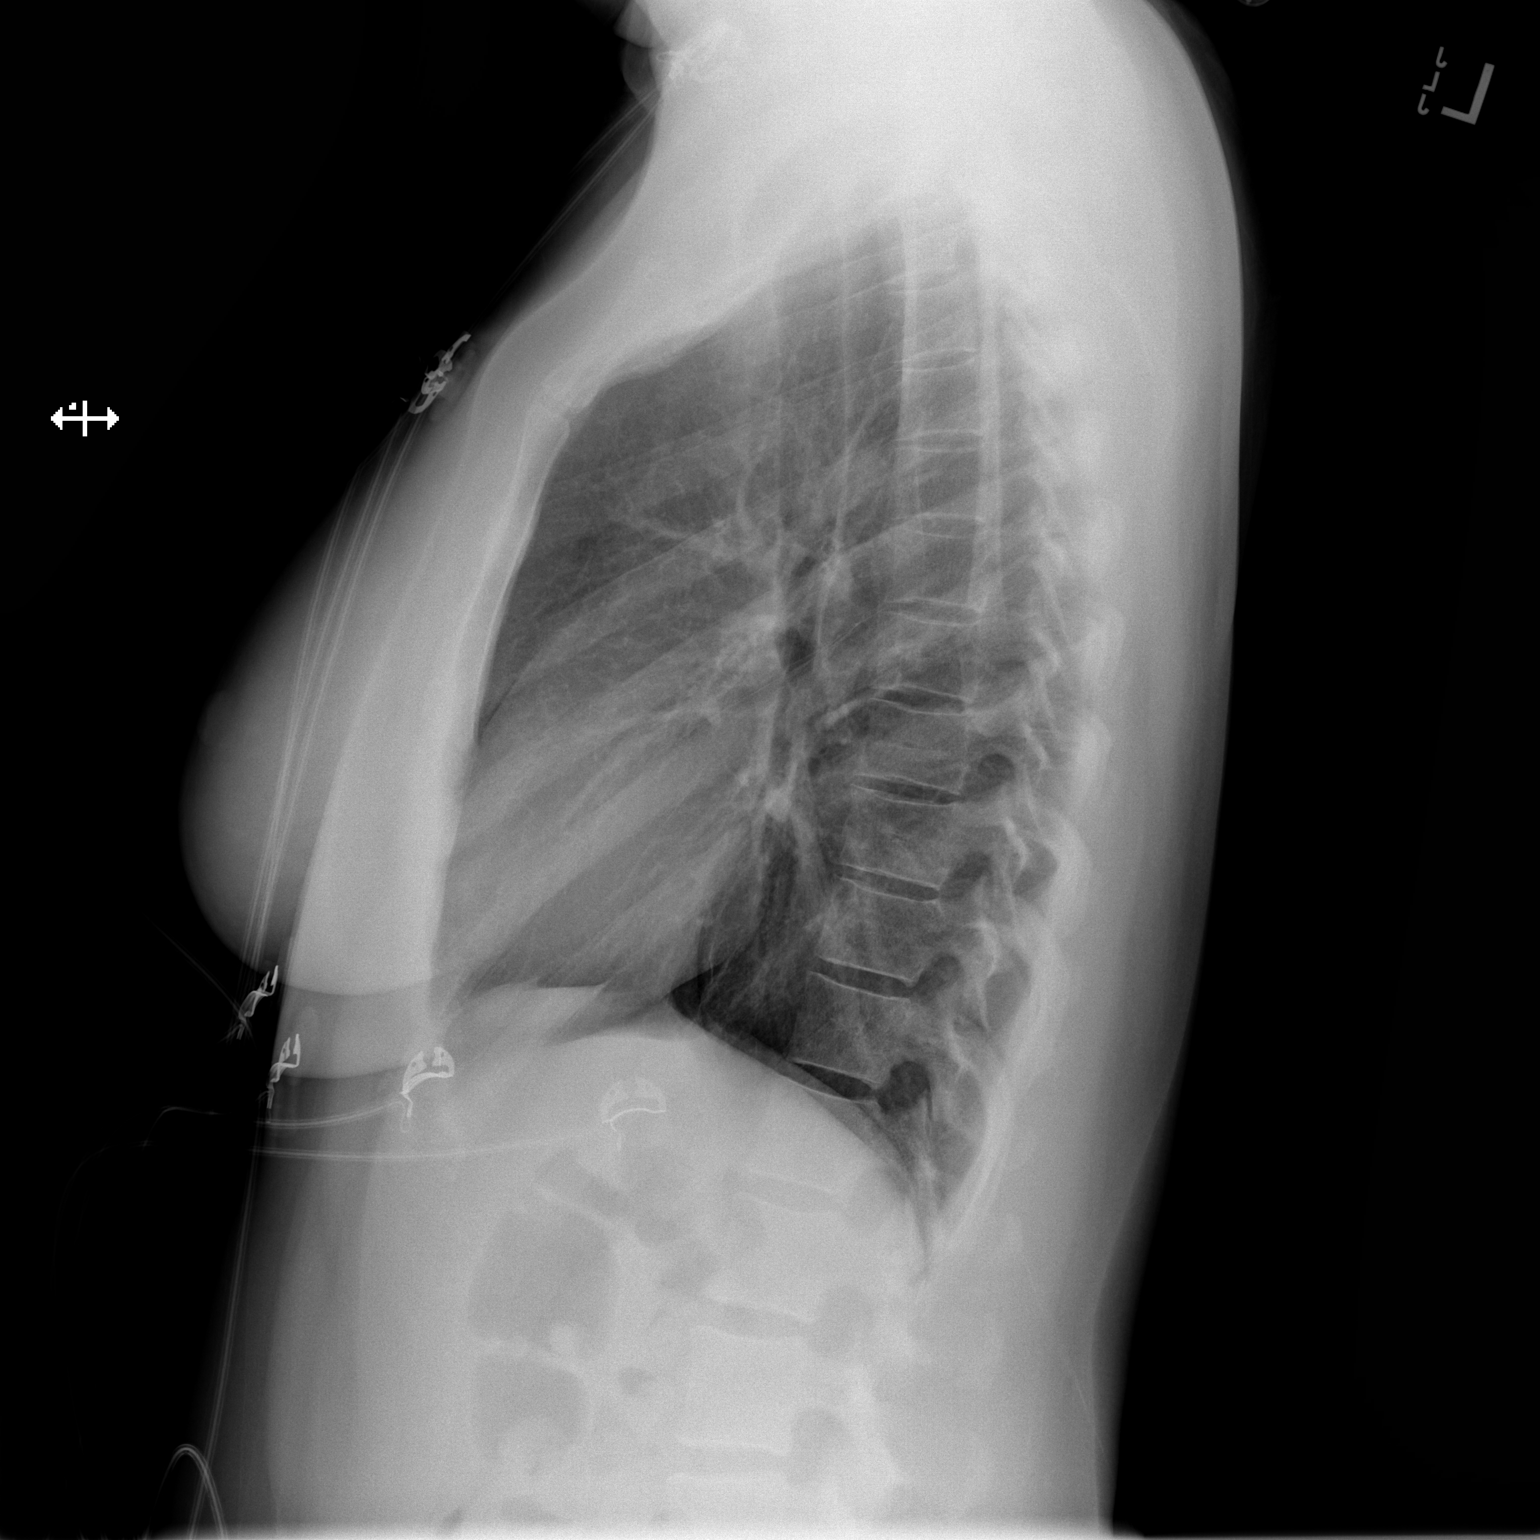

[2 of 2 positions shown; findings below may reference images not displayed]

FINDINGS: No consolidation, features of edema, pneumothorax, or effusion.
Pulmonary vascularity is normally distributed. The cardiomediastinal
contours are unremarkable. No acute osseous or soft tissue
abnormality. Telemetry leads overlie the chest.
IMPRESSION: No acute cardiopulmonary abnormality.

## 2023-03-06 NOTE — Progress Notes (Deleted)
 GYNECOLOGY  VISIT   HPI: 53 y.o.   Single  Caucasian female   G0P0000 with No LMP recorded.   here for: menopause consult     GYNECOLOGIC HISTORY: No LMP recorded. Contraception:  abstinence  Menopausal hormone therapy:  n/a Last 2 paps:  01/02/21 neg: HR HPV neg History of abnormal Pap or positive HPV:  no Mammogram:  04/18/22 Breast Density cat C, BI-RADS CAT 1 neg        OB History     Gravida  0   Para  0   Term  0   Preterm  0   AB  0   Living  0      SAB  0   IAB  0   Ectopic  0   Multiple  0   Live Births  0              Patient Active Problem List   Diagnosis Date Noted   Impingement syndrome of left shoulder region 08/25/2020   Dietary counseling and surveillance 04/26/2020   Obesity 04/26/2020   Angina pectoris ([facility]) 04/26/2020   Fibroids    Hyperlipidemia    Irregular menses    Lower back pain    Migraine    Vitamin D deficiency    Bursitis 01/23/2020   Mixed dyslipidemia 09/04/2019   Seasonal allergies 06/03/2019   Palpitations 06/03/2019   Elevated blood pressure reading in office without diagnosis of hypertension 06/03/2019   Cardiac murmur 06/03/2019   Fibroid uterus 10/25/2016   Fibroid 01/05/2016    Past Medical History:  Diagnosis Date   Angina pectoris ([facility]) 04/26/2020   Bursitis 01/23/2020   left shoulder   Cardiac murmur 06/03/2019   Dietary counseling and surveillance 04/26/2020   Elevated blood pressure reading in office without diagnosis of hypertension 06/03/2019   Fibroid 01/05/2016   3 fibroids with one large pedunculated fibroid.  Ultrasound yearly.   Fibroid uterus 10/25/2016   Fibroids    Hyperlipidemia    Impingement syndrome of left shoulder region 08/25/2020   Irregular menses    Lower back pain    Migraine    without aura   Mixed dyslipidemia 09/04/2019   Obesity 04/26/2020   Palpitations 06/03/2019   Seasonal allergies 06/03/2019   Vitamin D deficiency     Past Surgical History:   Procedure Laterality Date   LAPAROSCOPIC GELPORT ASSISTED MYOMECTOMY N/A 10/29/2017   Procedure: LAPAROSCOPIC GELPORT ASSISTED MYOMECTOMY, EXCISION OF ENDOMETRIOSIS, LYSIS OF ADHESIONS;  Surgeon: Fermin Schwab, MD;  Location: [facility];  Service: Gynecology;  Laterality: N/A;   WISDOM TOOTH EXTRACTION Bilateral age 37    Current Outpatient Medications  Medication Sig Dispense Refill   acetaminophen (TYLENOL) 650 MG CR tablet Take 650 mg by mouth in the morning, at noon, in the evening, and at bedtime. Take 1-2 tablets QID PRN     Ascorbic Acid (VITAMIN C) 500 MG CAPS Take 500 mg by mouth daily.     aspirin EC 81 MG tablet Take 81 mg by mouth once a week.      Calcium Carb-Cholecalciferol 600-800 MG-UNIT TABS Take 1 tablet by mouth daily.      fluticasone (FLONASE) 50 MCG/ACT nasal spray Place 2 sprays into the nose daily as needed for allergies.      ibuprofen (ADVIL,MOTRIN) 200 MG tablet Take 400 mg by mouth every 8 (eight) hours as needed for mild pain.      loratadine (CLARITIN) 10 MG tablet Take  10 mg by mouth daily.     meclizine (ANTIVERT) 12.5 MG tablet Take 12.5 mg by mouth as needed for dizziness.     metoprolol tartrate (LOPRESSOR) 50 MG tablet Take 25 mg by mouth 2 (two) times daily.      montelukast (SINGULAIR) 10 MG tablet Take 10 mg by mouth at bedtime.      nitroGLYCERIN (NITROSTAT) 0.3 MG SL tablet Place 0.3 mg under the tongue every 5 (five) minutes as needed for chest pain.      SUMAtriptan (IMITREX) 25 MG tablet Take 1 tablet by mouth every 2 (two) hours as needed for migraine.   0   Vitamin D, Ergocalciferol, (DRISDOL) 50000 UNITS CAPS capsule Take 1 capsule by mouth once a week.     No current facility-administered medications for this visit.     ALLERGIES: Patient has no known allergies.  Family History  Problem Relation Age of Onset   Hypertension Mother    Other Mother        benign abdominal tumor x 2   Hypertension Father     Hypertension Maternal Grandmother    Hypertension Paternal Grandmother    Lymphoma Cousin 30       Non hogkins lymphoma    Social History   Socioeconomic History   Marital status: Single    Spouse name: Not on file   Number of children: 0   Years of education: Not on file   Highest education level: Not on file  Occupational History    Employer: [employer]  Tobacco Use   Smoking status: Never   Smokeless tobacco: Never  Vaping Use   Vaping status: Never Used  Substance and Sexual Activity   Alcohol use: No   Drug use: No   Sexual activity: Not Currently    Partners: Male  Other Topics Concern   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   Social Drivers of Corporate investment banker Strain: Not on file  Food Insecurity: Not on file  Transportation Needs: Not on file  Physical Activity: Not on file  Stress: Not on file  Social Connections: Not on file  Intimate Partner Violence: Not on file    Review of Systems  PHYSICAL EXAMINATION:   There were no vitals taken for this visit.    General appearance: alert, cooperative and appears stated age Head: Normocephalic, without obvious abnormality, atraumatic Neck: no adenopathy, supple, symmetrical, trachea midline and thyroid normal to inspection and palpation Lungs: clear to auscultation bilaterally Breasts: normal appearance, no masses or tenderness, No nipple retraction or dimpling, No nipple discharge or bleeding, No axillary or supraclavicular adenopathy Heart: regular rate and rhythm Abdomen: soft, non-tender, no masses,  no organomegaly Extremities: extremities normal, atraumatic, no cyanosis or edema Skin: Skin color, texture, turgor normal. No rashes or lesions Lymph nodes: Cervical, supraclavicular, and axillary nodes normal. No abnormal inguinal nodes palpated Neurologic: Grossly normal  Pelvic: External genitalia:  no  lesions              Urethra:  normal appearing urethra with no masses, tenderness or lesions              Bartholins and Skenes: normal                 Vagina: normal appearing vagina with normal color and discharge, no lesions  Cervix: no lesions                Bimanual Exam:  Uterus:  normal size, contour, position, consistency, mobility, non-tender              Adnexa: no mass, fullness, tenderness              Rectal exam: {yes no:314532}.  Confirms.              Anus:  normal sphincter tone, no lesions  Chaperone was present for exam:  {BSCHAPERONE:31226::"Brighton Pilley F, CMA"}  ASSESSMENT:    PLAN:    {LABS (Optional):23779}  ***  total time was spent for this patient encounter, including preparation, face-to-face counseling with the patient, coordination of care, and documentation of the encounter.

## 2023-03-20 ENCOUNTER — Ambulatory Visit: Payer: PRIVATE HEALTH INSURANCE | Admitting: Obstetrics and Gynecology

## 2023-03-31 IMAGING — CT CT HEART MORP W/ CTA COR W/ SCORE W/ CA W/CM &/OR W/O CM
4 of 7 series · 8 of 20 positions shown, 9 images · IV contrast (APPLIED)
Comparison: CT angio chest 06/29/2019

Addendum:
CLINICAL DATA: Chest pain

EXAM:
Cardiac/Coronary CTA
TECHNIQUE: A non-contrast, gated CT scan was obtained with axial slices of 3 mm
through the heart for calcium scoring. Calcium scoring was performed
using the Agatston method. A 100 kV prospective, gated, contrast
cardiac scan was obtained. Gantry rotation speed was 250 msecs and
collimation was 0.6 mm. Two sublingual nitroglycerin tablets (0.8
mg) were given. The 3D data set was reconstructed in 5% intervals of
the 35-75% of the R-R cycle. Diastolic phases were analyzed on a
dedicated workstation using MPR, MIP, and VRT modes. The patient
received 95 cc of contrast.

[Series 6: best diast 72 % · axial · 0.39mm/px · z∈[-136,-97]mm · 2 of 298 slices shown, 3 images]
[im 100/298  vessel]
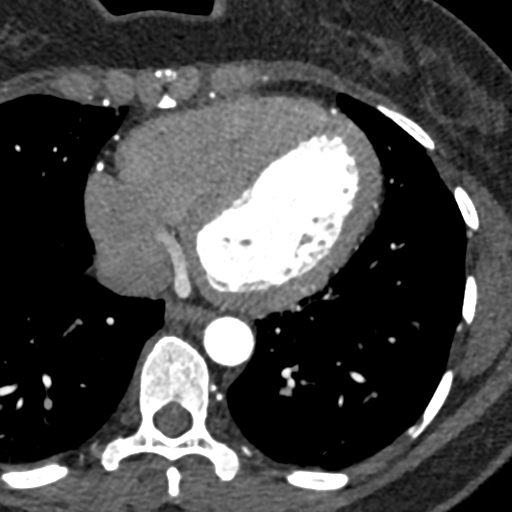
[im 100/298  lung]
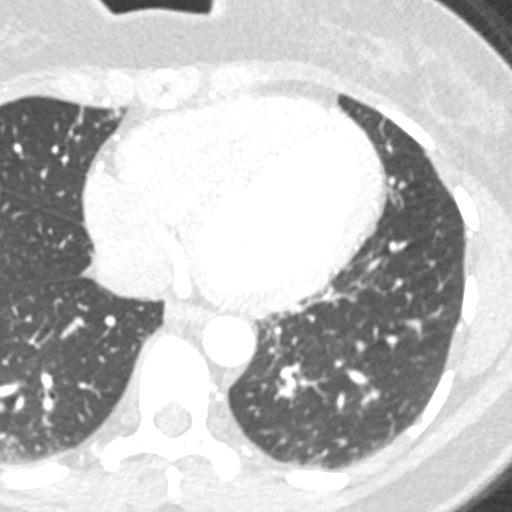
[im 199/298  vessel]
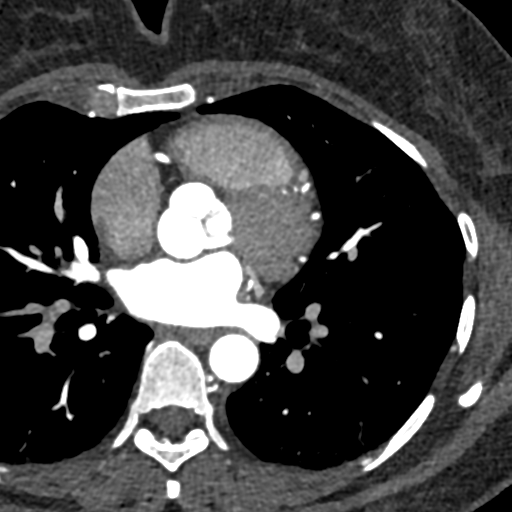

[Series 7: best syst 35 % · axial · 0.39mm/px · z∈[-136,-97]mm · 2 of 298 slices shown]
[im 100/298  vessel]
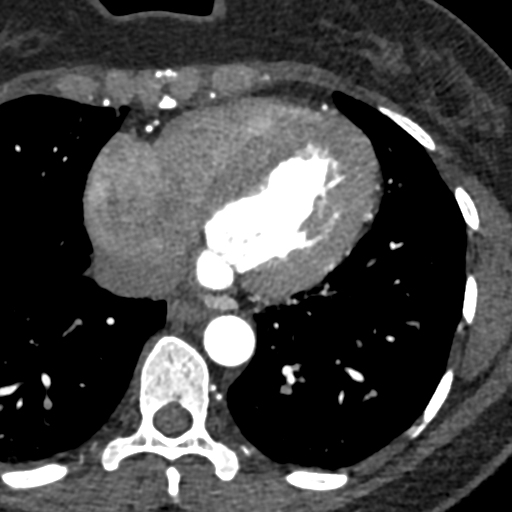
[im 199/298  vessel]
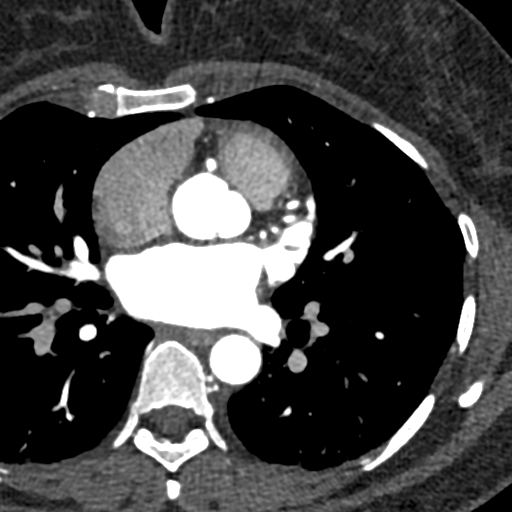

[Series 8: ts diast sharp 72 % · axial · 0.39mm/px · z∈[-136,-97]mm · 2 of 298 slices shown]
[im 100/298  lung]
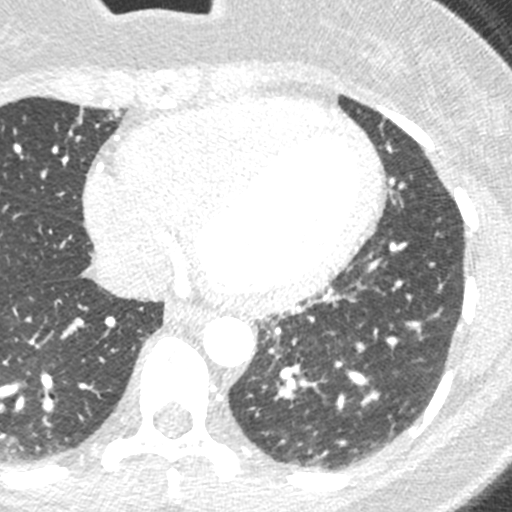
[im 199/298  lung]
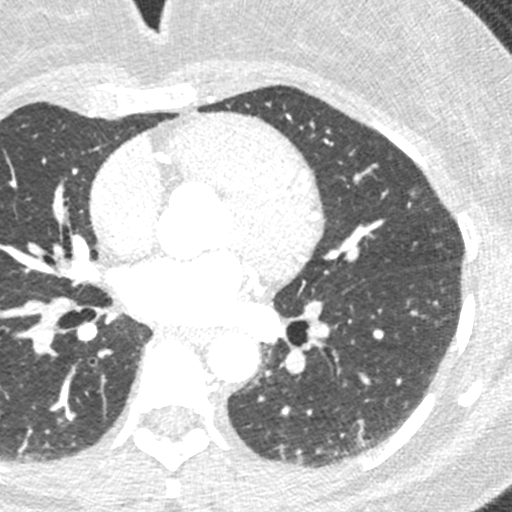

[Series 9: ts syst sharp 35 % · axial · 0.39mm/px · z∈[-136,-97]mm · 2 of 298 slices shown]
[im 100/298  lung]
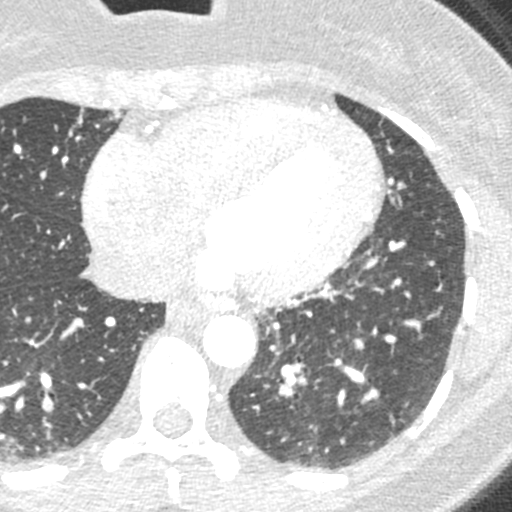
[im 199/298  lung]
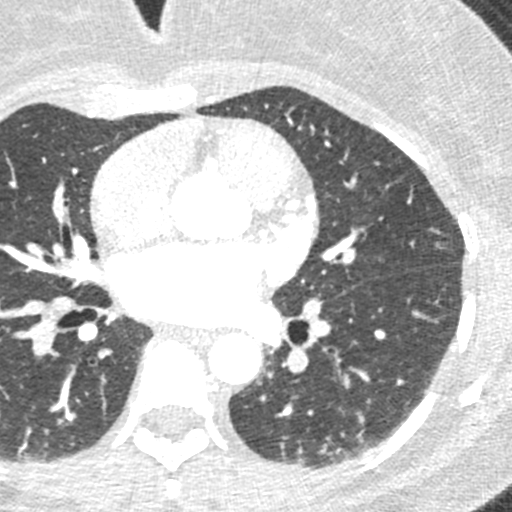

[8 of 20 positions shown; findings below may reference images not displayed]

FINDINGS: Image quality: Excellent.

Noise artifact is: Limited.

Coronary Arteries:  Normal coronary origin.  Right dominance.

Left main: The left main is a large caliber vessel with a normal
take off from the left coronary cusp that bifurcates to form a left
anterior descending artery and a left circumflex artery. There is no
plaque or stenosis.

Left anterior descending artery: The LAD is patent without evidence
of plaque or stenosis. The LAD gives off 1 large patent diagonal
branch.

Left circumflex artery: The LCX is non-dominant and patent with no
evidence of plaque or stenosis. The LCX gives off 1 patent obtuse
marginal branch.

Right coronary artery: The RCA is dominant with normal take off from
the right coronary cusp. There is no evidence of plaque or stenosis.
The RCA terminates as a PDA and right posterolateral branch without
evidence of plaque or stenosis.

Right Atrium: Right atrial size is within normal limits.

Right Ventricle: The right ventricular cavity is within normal
limits.

Left Atrium: Left atrial size is normal in size with no left atrial
appendage filling defect.

Left Ventricle: The ventricular cavity size is within normal limits.
There are no stigmata of prior infarction. There is no abnormal
filling defect.

Pulmonary arteries: Normal in size without proximal filling defect.

Pulmonary veins: Normal pulmonary venous drainage.

Pericardium: Normal thickness with no significant effusion or
calcium present.

Cardiac valves: The aortic valve is trileaflet without significant
calcification. The mitral valve is normal structure without
significant calcification.

Aorta: Normal caliber with no significant disease.

Extra-cardiac findings: See attached radiology report for
non-cardiac structures.
IMPRESSION: 1. Coronary calcium score of 0.

2. Normal coronary origin with right dominance.

3. Normal coronary arteries.

RECOMMENDATIONS:
1. No evidence of CAD (0%). Consider non-atherosclerotic causes of
chest pain.

EXAM:
OVER-READ INTERPRETATION  CT CHEST

The following report is an over-read performed by radiologist Dr.
over-read does not include interpretation of cardiac or coronary
anatomy or pathology. The coronary CTA interpretation by the
cardiologist is attached.
FINDINGS: Mediastinum/nodes: No mass or adenopathy identified.

Lungs/pleura: No pleural effusion, airspace consolidation,
atelectasis or pneumothorax identified. No suspicious nodule or
mass.

Upper abdomen: No acute abnormality.

Musculoskeletal: No acute or suspicious finding.
IMPRESSION: Negative over-read.

*** End of Addendum ***
FINDINGS: Image quality: Excellent.

Noise artifact is: Limited.

Coronary Arteries:  Normal coronary origin.  Right dominance.

Left main: The left main is a large caliber vessel with a normal
take off from the left coronary cusp that bifurcates to form a left
anterior descending artery and a left circumflex artery. There is no
plaque or stenosis.

Left anterior descending artery: The LAD is patent without evidence
of plaque or stenosis. The LAD gives off 1 large patent diagonal
branch.

Left circumflex artery: The LCX is non-dominant and patent with no
evidence of plaque or stenosis. The LCX gives off 1 patent obtuse
marginal branch.

Right coronary artery: The RCA is dominant with normal take off from
the right coronary cusp. There is no evidence of plaque or stenosis.
The RCA terminates as a PDA and right posterolateral branch without
evidence of plaque or stenosis.

Right Atrium: Right atrial size is within normal limits.

Right Ventricle: The right ventricular cavity is within normal
limits.

Left Atrium: Left atrial size is normal in size with no left atrial
appendage filling defect.

Left Ventricle: The ventricular cavity size is within normal limits.
There are no stigmata of prior infarction. There is no abnormal
filling defect.

Pulmonary arteries: Normal in size without proximal filling defect.

Pulmonary veins: Normal pulmonary venous drainage.

Pericardium: Normal thickness with no significant effusion or
calcium present.

Cardiac valves: The aortic valve is trileaflet without significant
calcification. The mitral valve is normal structure without
significant calcification.

Aorta: Normal caliber with no significant disease.

Extra-cardiac findings: See attached radiology report for
non-cardiac structures.
IMPRESSION: 1. Coronary calcium score of 0.

2. Normal coronary origin with right dominance.

3. Normal coronary arteries.

RECOMMENDATIONS:
1. No evidence of CAD (0%). Consider non-atherosclerotic causes of
chest pain.

## 2023-06-20 ENCOUNTER — Ambulatory Visit: Payer: PRIVATE HEALTH INSURANCE | Admitting: Obstetrics and Gynecology

## 2023-06-26 ENCOUNTER — Encounter: Payer: Self-pay | Admitting: Obstetrics and Gynecology

## 2023-06-26 ENCOUNTER — Ambulatory Visit (INDEPENDENT_AMBULATORY_CARE_PROVIDER_SITE_OTHER): Payer: PRIVATE HEALTH INSURANCE | Admitting: Obstetrics and Gynecology

## 2023-06-26 ENCOUNTER — Other Ambulatory Visit (HOSPITAL_COMMUNITY)
Admission: RE | Admit: 2023-06-26 | Discharge: 2023-06-26 | Disposition: A | Discharge status: Home / Self Care | Payer: PRIVATE HEALTH INSURANCE | Source: Ambulatory Visit | Attending: Obstetrics and Gynecology | Admitting: Obstetrics and Gynecology

## 2023-06-26 VITALS — BP 114/76 | HR 97 | Ht 68.0 in | Wt 176.0 lb

## 2023-06-26 DIAGNOSIS — Z01419 Encounter for gynecological examination (general) (routine) without abnormal findings: Secondary | ICD-10-CM

## 2023-06-26 DIAGNOSIS — Z114 Encounter for screening for human immunodeficiency virus [HIV]: Secondary | ICD-10-CM

## 2023-06-26 DIAGNOSIS — N951 Menopausal and female climacteric states: Secondary | ICD-10-CM | POA: Diagnosis not present

## 2023-06-26 DIAGNOSIS — Z1159 Encounter for screening for other viral diseases: Secondary | ICD-10-CM

## 2023-06-26 DIAGNOSIS — Z1331 Encounter for screening for depression: Secondary | ICD-10-CM | POA: Diagnosis not present

## 2023-06-26 DIAGNOSIS — Z113 Encounter for screening for infections with a predominantly sexual mode of transmission: Secondary | ICD-10-CM | POA: Insufficient documentation

## 2023-06-26 MED ORDER — NORETHINDRONE 0.35 MG PO TABS: 1.0000 | ORAL_TABLET | Freq: Every day | ORAL | 3 refills | Status: AC

## 2023-06-26 NOTE — Progress Notes (Signed)
 53 y.o. G0P0000 Single African American female here for annual exam.  Wants to have STD testing, having menopause symptoms, wants to discuss her chances of getting pregnant (does not want kids), wants to know what she can take to prepare herself for sex after not having sex for long time.   Having hot flashes, brain fog and fatigue.  Sleeps with a fan on.  LMP was February, 2025.  Had monthly menses prior to this.   Marrying in December.   Working in palliative care.   PCP: Tamela Fake, MD   Patient's last menstrual period was 03/06/2023 (exact date).           Sexually active: No.  The current method of family planning is abstinence.    Menopausal hormone therapy:  n/a Exercising: Yes.    Weight training with coach Smoker:  no  OB History  Gravida Para Term Preterm AB Living  0 0 0 0 0 0  SAB IAB Ectopic Multiple Live Births  0 0 0 0 0     HEALTH MAINTENANCE: Last 2 paps:  01/02/21 neg HR HPV neg History of abnormal Pap or positive HPV:  no Mammogram:   04/18/22 Breast Density Cat C, BIRADS Cat 1 neg.  Has appt on 07/13/23.   Colonoscopy:  05/12/21 Bone Density:  n/a  Result  n/a   Immunization History  Administered Date(s) Administered   Influenza,inj,Quad PF,6+ Mos 10/21/2017, 01/02/2021   Tdap 01/30/2007      reports that she has never smoked. She has never used smokeless tobacco. She reports that she does not drink alcohol and does not use drugs.  Past Medical History:  Diagnosis Date   Angina pectoris (HCC) 04/26/2020   Bursitis 01/23/2020   left shoulder   Cardiac murmur 06/03/2019   Dietary counseling and surveillance 04/26/2020   Elevated blood pressure reading in office without diagnosis of hypertension 06/03/2019   Fibroid 01/05/2016   3 fibroids with one large pedunculated fibroid.  Ultrasound yearly.   Fibroid uterus 10/25/2016   Fibroids    Hyperlipidemia    Impingement syndrome of left shoulder region 08/25/2020   Irregular menses    Lower  back pain    Migraine    without aura   Mixed dyslipidemia 09/04/2019   Obesity 04/26/2020   Palpitations 06/03/2019   Seasonal allergies 06/03/2019   Vitamin D  deficiency     Past Surgical History:  Procedure Laterality Date   LAPAROSCOPIC GELPORT ASSISTED MYOMECTOMY N/A 10/29/2017   Procedure: LAPAROSCOPIC GELPORT ASSISTED MYOMECTOMY, EXCISION OF ENDOMETRIOSIS, LYSIS OF ADHESIONS;  Surgeon: Yalcinkaya, Tamer, MD;  Location: [facility];  Service: Gynecology;  Laterality: N/A;   WISDOM TOOTH EXTRACTION Bilateral age 24    Current Outpatient Medications  Medication Sig Dispense Refill   acetaminophen  (TYLENOL ) 650 MG CR tablet Take 650 mg by mouth in the morning, at noon, in the evening, and at bedtime. Take 1-2 tablets QID PRN     Ascorbic Acid (VITAMIN C) 500 MG CAPS Take 500 mg by mouth daily.     aspirin EC 81 MG tablet Take 81 mg by mouth once a week.      Calcium Carb-Cholecalciferol 600-800 MG-UNIT TABS Take 1 tablet by mouth daily.      fluticasone (FLONASE) 50 MCG/ACT nasal spray Place 2 sprays into the nose daily as needed for allergies.      ibuprofen (ADVIL,MOTRIN) 200 MG tablet Take 400 mg by mouth every 8 (eight) hours as needed for mild  pain.      loratadine (CLARITIN) 10 MG tablet Take 10 mg by mouth daily.     meclizine (ANTIVERT) 12.5 MG tablet Take 12.5 mg by mouth as needed for dizziness.     metoprolol tartrate (LOPRESSOR) 50 MG tablet Take 25 mg by mouth 2 (two) times daily.      montelukast (SINGULAIR) 10 MG tablet Take 10 mg by mouth at bedtime.      nitroGLYCERIN  (NITROSTAT ) 0.3 MG SL tablet Place 0.3 mg under the tongue every 5 (five) minutes as needed for chest pain.      Nutritional Supplements (ESTROVEN PO) Take by mouth.     SUMAtriptan (IMITREX) 25 MG tablet Take 1 tablet by mouth every 2 (two) hours as needed for migraine.   0   Vitamin D , Ergocalciferol , (DRISDOL) 50000 UNITS CAPS capsule Take 1 capsule by mouth once a week.     No  current facility-administered medications for this visit.    ALLERGIES: Patient has no known allergies.  Family History  Problem Relation Age of Onset   Hypertension Mother    Other Mother        benign abdominal tumor x 2   Hypertension Father    Hypertension Maternal Grandmother    Hypertension Paternal Grandmother    Lymphoma Cousin 30       Non hogkins lymphoma    Review of Systems  All other systems reviewed and are negative.   PHYSICAL EXAM:  BP 114/76 (BP Location: Right Arm, Patient Position: Sitting)   Pulse 97   Ht 5\' 8"  (1.727 m)   Wt 176 lb (79.8 kg)   LMP 03/06/2023 (Exact Date) Comment: Last period was in Febuary 2024  SpO2 97%   BMI 26.76 kg/m     General appearance: alert, cooperative and appears stated age Head: normocephalic, without obvious abnormality, atraumatic Neck: no adenopathy, supple, symmetrical, trachea midline and thyroid normal to inspection and palpation Lungs: clear to auscultation bilaterally Breasts: normal appearance, no masses or tenderness, No nipple retraction or dimpling, No nipple discharge or bleeding, No axillary adenopathy Heart: regular rate and rhythm Abdomen: soft, non-tender; no masses, no organomegaly Extremities: extremities normal, atraumatic, no cyanosis or edema Skin: skin color, texture, turgor normal. No rashes or lesions Lymph nodes: cervical, supraclavicular, and axillary nodes normal. Neurologic: grossly normal  Pelvic: External genitalia:  no lesions              No abnormal inguinal nodes palpated.              Urethra:  normal appearing urethra with no masses, tenderness or lesions              Bartholins and Skenes: normal                 Vagina: normal appearing vagina with normal color and discharge, no lesions              Cervix: no lesions              Pap taken: no Bimanual Exam:  Uterus:  normal size, contour, position, consistency, mobility, non-tender              Adnexa: no mass, fullness,  tenderness              Rectal exam: yes.  Confirms.              Anus:  normal sphincter tone, no lesions  Chaperone was present for exam:  Jada M, CMA  ASSESSMENT: Well woman visit with gynecologic exam. Menopausal symptoms.  Perimenopausal female.  Desire to prevent pregnancy.  PHQ-9: 0  PLAN: Mammogram screening discussed. Self breast awareness reviewed. Pap and HRV collected:  no.  Will do next year.  Guidelines for Calcium, Vitamin D , regular exercise program including cardiovascular and weight bearing exercise. Medications:  Micronor.  Instructed in use.  STD screening.  Check FSH and estradiol . We discussed some treatments for menopausal symptoms: HRT, Veozah, Paxil, Effexor.  No Rx given.  Vaginal atrophy can be treated with water based lubricants, cooking oils, vaginal estrogens.  She will try over the counter remedies.  Follow up:  yearly and prn.

## 2023-06-26 NOTE — Patient Instructions (Signed)

## 2023-06-27 ENCOUNTER — Ambulatory Visit: Payer: Self-pay | Admitting: Obstetrics and Gynecology

## 2023-06-27 LAB — CERVICOVAGINAL ANCILLARY ONLY
Chlamydia: NEGATIVE
Comment: NEGATIVE
Comment: NEGATIVE
Comment: NORMAL
Neisseria Gonorrhea: NEGATIVE
Trichomonas: NEGATIVE

## 2023-06-27 LAB — RPR: RPR Ser Ql: NONREACTIVE

## 2023-06-27 LAB — ESTRADIOL: Estradiol: 26 pg/mL

## 2023-06-27 LAB — HEPATITIS C ANTIBODY: Hepatitis C Ab: NONREACTIVE

## 2023-06-27 LAB — HIV ANTIBODY (ROUTINE TESTING W REFLEX): HIV 1&2 Ab, 4th Generation: NONREACTIVE

## 2023-06-27 LAB — FOLLICLE STIMULATING HORMONE: FSH: 100.3 m[IU]/mL

## 2023-12-13 ENCOUNTER — Encounter: Payer: Self-pay | Admitting: Obstetrics and Gynecology

## 2024-01-06 ENCOUNTER — Encounter: Payer: Self-pay | Admitting: Obstetrics and Gynecology

## 2024-01-24 NOTE — Telephone Encounter (Signed)
 Please advise

## 2024-01-24 NOTE — Progress Notes (Incomplete)
 "  54 y.o. G0P0000 female here to confirm pregnancy. Single.  No LMP recorded.    She reports ***. Urine sample provided: ***  Birth control: *** Last mammogram: 04/18/22 Sexually active: ***    GYN HISTORY: ***  OB History  Gravida Para Term Preterm AB Living  0 0 0 0 0 0  SAB IAB Ectopic Multiple Live Births  0 0 0 0 0   Past Medical History:  Diagnosis Date   Angina pectoris 04/26/2020   Bursitis 01/23/2020   left shoulder   Cardiac murmur 06/03/2019   Dietary counseling and surveillance 04/26/2020   Elevated blood pressure reading in office without diagnosis of hypertension 06/03/2019   Fibroid 01/05/2016   3 fibroids with one large pedunculated fibroid.  Ultrasound yearly.   Fibroid uterus 10/25/2016   Fibroids    Hyperlipidemia    Impingement syndrome of left shoulder region 08/25/2020   Irregular menses    Lower back pain    Migraine    without aura   Mixed dyslipidemia 09/04/2019   Obesity 04/26/2020   Palpitations 06/03/2019   Seasonal allergies 06/03/2019   Vitamin D  deficiency    Past Surgical History:  Procedure Laterality Date   LAPAROSCOPIC GELPORT ASSISTED MYOMECTOMY N/A 10/29/2017   Procedure: LAPAROSCOPIC GELPORT ASSISTED MYOMECTOMY, EXCISION OF ENDOMETRIOSIS, LYSIS OF ADHESIONS;  Surgeon: Yalcinkaya, Tamer, MD;  Location: [facility];  Service: Gynecology;  Laterality: N/A;   WISDOM TOOTH EXTRACTION Bilateral age 47   Medications Ordered Prior to Encounter[1] Allergies[2]    PE There were no vitals filed for this visit. There is no height or weight on file to calculate BMI.  Physical Exam    Assessment and Plan:        There are no diagnoses linked to this encounter.  Clotilda FORBES Pa, CMA      [1]  Current Outpatient Medications on File Prior to Visit  Medication Sig Dispense Refill   acetaminophen  (TYLENOL ) 650 MG CR tablet Take 650 mg by mouth in the morning, at noon, in the evening, and at bedtime. Take 1-2  tablets QID PRN     Ascorbic Acid (VITAMIN C) 500 MG CAPS Take 500 mg by mouth daily.     aspirin EC 81 MG tablet Take 81 mg by mouth once a week.      Calcium Carb-Cholecalciferol 600-800 MG-UNIT TABS Take 1 tablet by mouth daily.      fluticasone (FLONASE) 50 MCG/ACT nasal spray Place 2 sprays into the nose daily as needed for allergies.      ibuprofen (ADVIL,MOTRIN) 200 MG tablet Take 400 mg by mouth every 8 (eight) hours as needed for mild pain.      loratadine (CLARITIN) 10 MG tablet Take 10 mg by mouth daily.     meclizine (ANTIVERT) 12.5 MG tablet Take 12.5 mg by mouth as needed for dizziness.     metoprolol tartrate (LOPRESSOR) 50 MG tablet Take 25 mg by mouth 2 (two) times daily.      montelukast (SINGULAIR) 10 MG tablet Take 10 mg by mouth at bedtime.      nitroGLYCERIN  (NITROSTAT ) 0.3 MG SL tablet Place 0.3 mg under the tongue every 5 (five) minutes as needed for chest pain.      norethindrone  (MICRONOR ) 0.35 MG tablet Take 1 tablet (0.35 mg total) by mouth daily. 84 tablet 3   Nutritional Supplements (ESTROVEN PO) Take by mouth.     SUMAtriptan (IMITREX) 25 MG tablet Take 1 tablet by mouth  every 2 (two) hours as needed for migraine.   0   Vitamin D , Ergocalciferol , (DRISDOL) 50000 UNITS CAPS capsule Take 1 capsule by mouth once a week.     No current facility-administered medications on file prior to visit.  [2] No Known Allergies  "

## 2024-01-27 ENCOUNTER — Other Ambulatory Visit: Payer: Self-pay | Admitting: Obstetrics and Gynecology

## 2024-01-27 ENCOUNTER — Encounter: Payer: Self-pay | Admitting: Obstetrics and Gynecology

## 2024-01-27 ENCOUNTER — Ambulatory Visit (INDEPENDENT_AMBULATORY_CARE_PROVIDER_SITE_OTHER): Payer: Self-pay | Admitting: Obstetrics and Gynecology

## 2024-01-27 VITALS — BP 126/82 | HR 85

## 2024-01-27 DIAGNOSIS — N914 Secondary oligomenorrhea: Secondary | ICD-10-CM | POA: Diagnosis not present

## 2024-01-27 DIAGNOSIS — D219 Benign neoplasm of connective and other soft tissue, unspecified: Secondary | ICD-10-CM | POA: Diagnosis not present

## 2024-01-27 DIAGNOSIS — R1031 Right lower quadrant pain: Secondary | ICD-10-CM | POA: Diagnosis not present

## 2024-01-27 DIAGNOSIS — R110 Nausea: Secondary | ICD-10-CM

## 2024-01-27 LAB — PREGNANCY, URINE: Preg Test, Ur: NEGATIVE

## 2024-01-27 NOTE — Telephone Encounter (Signed)
 Spoke with patient. Patient would like OV. Patient agreeable to change OV to visit with Dr. Nikki today, will need to arrive later, OV scheduled for 1600. Advised I will review with Dr. Nikki, may need labs drawn prior to rooming. Patient agreeable.   Dr. Nikki -please advise on lab orders if needed.

## 2024-01-27 NOTE — Telephone Encounter (Signed)
Patient seen in office today.   Encounter closed.  

## 2024-01-27 NOTE — Progress Notes (Unsigned)
 "  GYNECOLOGY  VISIT   HPI: 54 y.o.   Married  African American female   G0P0000 with No LMP recorded.  LMP was August.  Prior LMP was February.    here for: Nausea and oligomenorrhea.  Taking Micronor  since November, 2025.  Has bleeding after initiating medication.   Negative UPT at home last night.   RLQ pain for 4 weeks, intermittently.     Episode of significant nausea that resolved.  Lasted for 1 day.   Some low back discomfort.    Known fibroids.   Has some hot flashes.    Has used pills, patch, and maybe the ring in past for pregnancy prevention.  Desires to prevent pregnancy.  Hormone testing 06/26/23 indicates menopausal levels, but not menopausal by prior periods in the prior 12 months.  Currently taking progesterone only birth control pills.   UPT negative here now.      GYNECOLOGIC HISTORY: No LMP recorded. Contraception:  OCP  Menopausal hormone therapy:  n/a Last 2 paps:  01/02/21 neg HR HPV neg  History of abnormal Pap or positive HPV:  no Mammogram:  04/18/22 Breast Density Cat C, BIRADS Cat 1 neg.          OB History     Gravida  0   Para  0   Term  0   Preterm  0   AB  0   Living  0      SAB  0   IAB  0   Ectopic  0   Multiple  0   Live Births  0              Patient Active Problem List   Diagnosis Date Noted   Impingement syndrome of left shoulder region 08/25/2020   Dietary counseling and surveillance 04/26/2020   Obesity 04/26/2020   Angina pectoris 04/26/2020   Fibroids    Hyperlipidemia    Irregular menses    Lower back pain    Migraine    Vitamin D  deficiency    Bursitis 01/23/2020   Mixed dyslipidemia 09/04/2019   Seasonal allergies 06/03/2019   Palpitations 06/03/2019   Elevated blood pressure reading in office without diagnosis of hypertension 06/03/2019   Cardiac murmur 06/03/2019   Fibroid uterus 10/25/2016   Fibroid 01/05/2016    Past Medical History:  Diagnosis Date   Angina pectoris 04/26/2020    Bursitis 01/23/2020   left shoulder   Cardiac murmur 06/03/2019   Dietary counseling and surveillance 04/26/2020   Elevated blood pressure reading in office without diagnosis of hypertension 06/03/2019   Fibroid 01/05/2016   3 fibroids with one large pedunculated fibroid.  Ultrasound yearly.   Fibroid uterus 10/25/2016   Fibroids    Hyperlipidemia    Impingement syndrome of left shoulder region 08/25/2020   Irregular menses    Lower back pain    Migraine    without aura   Mixed dyslipidemia 09/04/2019   Obesity 04/26/2020   Palpitations 06/03/2019   Seasonal allergies 06/03/2019   Vitamin D  deficiency     Past Surgical History:  Procedure Laterality Date   LAPAROSCOPIC GELPORT ASSISTED MYOMECTOMY N/A 10/29/2017   Procedure: LAPAROSCOPIC GELPORT ASSISTED MYOMECTOMY, EXCISION OF ENDOMETRIOSIS, LYSIS OF ADHESIONS;  Surgeon: Yalcinkaya, Tamer, MD;  Location: [facility];  Service: Gynecology;  Laterality: N/A;   WISDOM TOOTH EXTRACTION Bilateral age 12    Current Outpatient Medications  Medication Sig Dispense Refill   acetaminophen  (TYLENOL ) 650 MG CR tablet  Take 650 mg by mouth in the morning, at noon, in the evening, and at bedtime. Take 1-2 tablets QID PRN     Ascorbic Acid (VITAMIN C) 500 MG CAPS Take 500 mg by mouth daily.     aspirin EC 81 MG tablet Take 81 mg by mouth once a week.      Calcium Carb-Cholecalciferol 600-800 MG-UNIT TABS Take 1 tablet by mouth daily.      fluticasone (FLONASE) 50 MCG/ACT nasal spray Place 2 sprays into the nose daily as needed for allergies.      ibuprofen (ADVIL,MOTRIN) 200 MG tablet Take 400 mg by mouth every 8 (eight) hours as needed for mild pain.      loratadine (CLARITIN) 10 MG tablet Take 10 mg by mouth daily.     meclizine (ANTIVERT) 12.5 MG tablet Take 12.5 mg by mouth as needed for dizziness.     metoprolol tartrate (LOPRESSOR) 50 MG tablet Take 25 mg by mouth 2 (two) times daily.      montelukast (SINGULAIR) 10 MG  tablet Take 10 mg by mouth at bedtime.      nitroGLYCERIN  (NITROSTAT ) 0.3 MG SL tablet Place 0.3 mg under the tongue every 5 (five) minutes as needed for chest pain.      norethindrone  (MICRONOR ) 0.35 MG tablet Take 1 tablet (0.35 mg total) by mouth daily. 84 tablet 3   Nutritional Supplements (ESTROVEN PO) Take by mouth.     SUMAtriptan (IMITREX) 25 MG tablet Take 1 tablet by mouth every 2 (two) hours as needed for migraine.   0   Vitamin D , Ergocalciferol , (DRISDOL) 50000 UNITS CAPS capsule Take 1 capsule by mouth once a week.     No current facility-administered medications for this visit.     ALLERGIES: Patient has no known allergies.  Family History  Problem Relation Age of Onset   Hypertension Mother    Other Mother        benign abdominal tumor x 2   Hypertension Father    Hypertension Maternal Grandmother    Hypertension Paternal Grandmother    Lymphoma Cousin 30       Non hogkins lymphoma    Social History   Socioeconomic History   Marital status: Married    Spouse name: Not on file   Number of children: 0   Years of education: Not on file   Highest education level: Not on file  Occupational History    Employer: [employer]  Tobacco Use   Smoking status: Never   Smokeless tobacco: Never  Vaping Use   Vaping status: Never Used  Substance and Sexual Activity   Alcohol use: No   Drug use: No   Sexual activity: Not Currently    Partners: Male  Other Topics Concern   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   Social Drivers of Health   Tobacco Use: Low Risk (01/27/2024)   Patient History    Smoking Tobacco Use: Never    Smokeless Tobacco Use: Never    Passive Exposure: Not on file  Financial Resource Strain: Not on file  Food Insecurity: Not on file  Transportation Needs: Not on file  Physical Activity: Not on file  Stress: Not on file  Social Connections: Not on file   Intimate Partner Violence: Not on file  Depression (PHQ2-9): Low Risk (06/26/2023)   Depression (PHQ2-9)    PHQ-2 Score: 0  Alcohol Screen: Not on file  Housing: Not on file  Utilities: Not on file  Health Literacy: Not on file    Review of Systems  See HPI.   PHYSICAL EXAMINATION:   BP 126/82 (BP Location: Right Arm, Patient Position: Sitting)   Pulse 85   SpO2 97%     General appearance: alert, cooperative and appears stated age  ASSESSMENT:  Secondary oligomenorrhea.  Likely perimenopausal. Hx fibroids.  RLQ pain.  Nausea episode. Uncertain etiology.  On progesterone only birth control. pills.   PLAN:  UPT:  negative.  Quant hCG, CMP, CBC with differential.   Continue Micronor .  We discussed option of Mirena  IUD and estrogen for pregnancy prevention and treatment of perimenopausal/menopausal symptoms as part of HRT treatment. Due to hx of fibroids, will get pelvic ultrasound to define anatomy and determine if a Mirena  IUD is potentially an option.   25 min  total time was spent for this patient encounter, including preparation, face-to-face counseling with the patient, coordination of care, and documentation of the encounter.    "

## 2024-01-28 ENCOUNTER — Ambulatory Visit: Payer: Self-pay | Admitting: Obstetrics and Gynecology

## 2024-01-28 LAB — CBC WITH DIFFERENTIAL/PLATELET
Absolute Lymphocytes: 2534 {cells}/uL (ref 850–3900)
Absolute Monocytes: 557 {cells}/uL (ref 200–950)
Basophils Absolute: 38 {cells}/uL (ref 0–200)
Basophils Relative: 0.6 %
Eosinophils Absolute: 307 {cells}/uL (ref 15–500)
Eosinophils Relative: 4.8 %
HCT: 39.4 % (ref 35.9–46.0)
Hemoglobin: 12.5 g/dL (ref 11.7–15.5)
MCH: 28.7 pg (ref 27.0–33.0)
MCHC: 31.7 g/dL (ref 31.6–35.4)
MCV: 90.4 fL (ref 81.4–101.7)
MPV: 11 fL (ref 7.5–12.5)
Monocytes Relative: 8.7 %
Neutro Abs: 2963 {cells}/uL (ref 1500–7800)
Neutrophils Relative %: 46.3 %
Platelets: 257 Thousand/uL (ref 140–400)
RBC: 4.36 Million/uL (ref 3.80–5.10)
RDW: 13.1 % (ref 11.0–15.0)
Total Lymphocyte: 39.6 %
WBC: 6.4 Thousand/uL (ref 3.8–10.8)

## 2024-01-28 LAB — COMPREHENSIVE METABOLIC PANEL WITH GFR
AG Ratio: 1.2 (calc) (ref 1.0–2.5)
ALT: 23 U/L (ref 6–29)
AST: 18 U/L (ref 10–35)
Albumin: 3.9 g/dL (ref 3.6–5.1)
Alkaline phosphatase (APISO): 76 U/L (ref 37–153)
BUN: 18 mg/dL (ref 7–25)
CO2: 27 mmol/L (ref 20–32)
Calcium: 9.3 mg/dL (ref 8.6–10.4)
Chloride: 107 mmol/L (ref 98–110)
Creat: 0.78 mg/dL (ref 0.50–1.03)
Globulin: 3.3 g/dL (ref 1.9–3.7)
Glucose, Bld: 103 mg/dL — ABNORMAL HIGH (ref 65–99)
Potassium: 4.3 mmol/L (ref 3.5–5.3)
Sodium: 142 mmol/L (ref 135–146)
Total Bilirubin: 0.2 mg/dL (ref 0.2–1.2)
Total Protein: 7.2 g/dL (ref 6.1–8.1)
eGFR: 92 mL/min/1.73m2

## 2024-01-28 LAB — HCG, QUANTITATIVE, PREGNANCY: HCG, Total, QN: <5 m[IU]/mL

## 2024-01-29 NOTE — Patient Instructions (Signed)
 Levonorgestrel Intrauterine Device (IUD) What is this medication? LEVONORGESTREL (LEE voe nor jes trel) prevents ovulation and pregnancy. It may also be used to treat heavy periods. It belongs to a group of medications called contraceptives. This medication is a progestin hormone. This medicine may be used for other purposes; ask your health care provider or pharmacist if you have questions. COMMON BRAND NAME(S): Cameron Ali What should I tell my care team before I take this medication? They need to know if you have any of these conditions: Abnormal Pap smear Cancer of the breast, uterus, or cervix Diabetes Endometritis Genital or pelvic infection now or in the past Have more than one sexual partner or your partner has more than one partner Heart disease History of an ectopic or tubal pregnancy Immune system problems IUD in place Liver disease or tumor Problems with blood clots or take blood-thinners Seizures Use intravenous drugs Uterus of unusual shape Vaginal bleeding that has not been explained An unusual or allergic reaction to levonorgestrel, other hormones, silicone, or polyethylene, medications, foods, dyes, or preservatives Pregnant or trying to get pregnant Breast-feeding How should I use this medication? This device is placed inside the uterus by your care team. A patient package insert for the product will be given each time it is inserted. Be sure to read this information carefully each time. The sheet may change often. Talk to your care team about use of this medication in children. Special care may be needed. Overdosage: If you think you have taken too much of this medicine contact a poison control center or emergency room at once. NOTE: This medicine is only for you. Do not share this medicine with others. What if I miss a dose? This does not apply. Depending on the brand of device you have inserted, the device will need to be replaced every 3 to 8  years if you wish to continue using this type of birth control. What may interact with this medication? Interactions are not expected. Tell your care team about all the medications you take. This list may not describe all possible interactions. Give your health care provider a list of all the medicines, herbs, non-prescription drugs, or dietary supplements you use. Also tell them if you smoke, drink alcohol, or use illegal drugs. Some items may interact with your medicine. What should I watch for while using this medication? Visit your care team for regular check-ups. Tell your care team if you or your partner becomes HIV positive or gets a sexually transmitted disease. Using this medication does not protect you or your partner against HIV or other sexually transmitted infections (STIs). You can check the placement of the IUD yourself by reaching up to the top of your vagina with clean fingers to feel the threads. Do not pull on the threads. It is a good habit to check placement after each menstrual period. Call your care team right away if you feel more of the IUD than just the threads or if you cannot feel the threads at all. The IUD may come out by itself. You may become pregnant if the device comes out. If you notice that the IUD has come out use a backup birth control method like condoms and call your care team. Using tampons will not change the position of the IUD and are okay to use during your period. This IUD can be safely scanned with magnetic resonance imaging (MRI) only under specific conditions. Before you have an MRI, tell your care team  that you have an IUD in place, and which type of IUD you have in place. What side effects may I notice from receiving this medication? Side effects that you should report to your care team as soon as possible: Allergic reactions--skin rash, itching, hives, swelling of the face, lips, tongue, or throat Blood clot--pain, swelling, or warmth in the leg,  shortness of breath, chest pain Gallbladder problems--severe stomach pain, nausea, vomiting, fever Increase in blood pressure Liver injury--right upper belly pain, loss of appetite, nausea, light-colored stool, dark yellow or brown urine, yellowing skin or eyes, unusual weakness or fatigue New or worsening migraines or headaches Pelvic inflammatory disease (PID)--fever, abdominal pain, pelvic pain, pain or trouble passing urine, spotting, bleeding during or after sex Stroke--sudden numbness or weakness of the face, arm, or leg, trouble speaking, confusion, trouble walking, loss of balance or coordination, dizziness, severe headache, change in vision Unusual vaginal discharge, itching, or odor Vaginal pain, irritation, or sores Worsening mood, feelings of depression Side effects that usually do not require medical attention (report to your care team if they continue or are bothersome): Breast pain or tenderness Dark patches of skin on the face or other sun-exposed areas Irregular menstrual cycles or spotting Nausea Weight gain This list may not describe all possible side effects. Call your doctor for medical advice about side effects. You may report side effects to FDA at 1-800-FDA-1088. Where should I keep my medication? This does not apply. NOTE: This sheet is a summary. It may not cover all possible information. If you have questions about this medicine, talk to your doctor, pharmacist, or health care provider.  2024 [employer] Standard (2020-09-21 00:00:00)

## 2024-03-04 ENCOUNTER — Other Ambulatory Visit: Admitting: Obstetrics and Gynecology

## 2024-03-04 ENCOUNTER — Other Ambulatory Visit

## 2024-06-29 ENCOUNTER — Ambulatory Visit: Payer: PRIVATE HEALTH INSURANCE | Admitting: Obstetrics and Gynecology
